# Patient Record
Sex: Female | Born: 1940 | Race: Black or African American | Hispanic: No | Marital: Single | Smoking: Former smoker
Health system: Southern US, Community
[De-identification: ages and names within clinical notes are randomized; demographics above are authoritative.]

## PROBLEM LIST (undated history)

## (undated) DIAGNOSIS — R011 Cardiac murmur, unspecified: Secondary | ICD-10-CM

## (undated) DIAGNOSIS — T4145XA Adverse effect of unspecified anesthetic, initial encounter: Secondary | ICD-10-CM

## (undated) DIAGNOSIS — R0602 Shortness of breath: Secondary | ICD-10-CM

## (undated) DIAGNOSIS — E039 Hypothyroidism, unspecified: Secondary | ICD-10-CM

## (undated) DIAGNOSIS — E785 Hyperlipidemia, unspecified: Secondary | ICD-10-CM

## (undated) DIAGNOSIS — F419 Anxiety disorder, unspecified: Secondary | ICD-10-CM

## (undated) DIAGNOSIS — D151 Benign neoplasm of heart: Secondary | ICD-10-CM

## (undated) DIAGNOSIS — N189 Chronic kidney disease, unspecified: Secondary | ICD-10-CM

## (undated) DIAGNOSIS — R42 Dizziness and giddiness: Secondary | ICD-10-CM

## (undated) DIAGNOSIS — I10 Essential (primary) hypertension: Secondary | ICD-10-CM

## (undated) DIAGNOSIS — M549 Dorsalgia, unspecified: Secondary | ICD-10-CM

## (undated) DIAGNOSIS — Z9289 Personal history of other medical treatment: Secondary | ICD-10-CM

## (undated) DIAGNOSIS — G4730 Sleep apnea, unspecified: Secondary | ICD-10-CM

## (undated) DIAGNOSIS — E079 Disorder of thyroid, unspecified: Secondary | ICD-10-CM

## (undated) DIAGNOSIS — I209 Angina pectoris, unspecified: Secondary | ICD-10-CM

## (undated) DIAGNOSIS — M1990 Unspecified osteoarthritis, unspecified site: Secondary | ICD-10-CM

## (undated) DIAGNOSIS — R002 Palpitations: Secondary | ICD-10-CM

## (undated) DIAGNOSIS — F32A Depression, unspecified: Secondary | ICD-10-CM

## (undated) DIAGNOSIS — F329 Major depressive disorder, single episode, unspecified: Secondary | ICD-10-CM

## (undated) HISTORY — DX: Dorsalgia, unspecified: M54.9

## (undated) HISTORY — DX: Hyperlipidemia, unspecified: E78.5

## (undated) HISTORY — DX: Dizziness and giddiness: R42

## (undated) HISTORY — DX: Essential (primary) hypertension: I10

## (undated) HISTORY — DX: Disorder of thyroid, unspecified: E07.9

## (undated) HISTORY — DX: Unspecified osteoarthritis, unspecified site: M19.90

## (undated) HISTORY — PX: BACK SURGERY: SHX140

## (undated) HISTORY — PX: TOTAL KNEE ARTHROPLASTY: SHX125

## (undated) HISTORY — DX: Benign neoplasm of heart: D15.1

## (undated) HISTORY — DX: Palpitations: R00.2

## (undated) HISTORY — PX: ABDOMINAL HYSTERECTOMY: SHX81

## (undated) HISTORY — DX: Cardiac murmur, unspecified: R01.1

## (undated) HISTORY — PX: CARDIAC CATHETERIZATION: SHX172

---

## 1998-07-25 ENCOUNTER — Ambulatory Visit (HOSPITAL_COMMUNITY): Admission: RE | Admit: 1998-07-25 | Discharge: 1998-07-25 | Payer: Self-pay | Admitting: Gastroenterology

## 1998-10-04 ENCOUNTER — Encounter: Admission: RE | Admit: 1998-10-04 | Discharge: 1999-01-02 | Payer: Self-pay | Admitting: Anesthesiology

## 1999-02-20 ENCOUNTER — Encounter: Admission: RE | Admit: 1999-02-20 | Discharge: 1999-05-21 | Payer: Self-pay | Admitting: *Deleted

## 1999-03-22 ENCOUNTER — Encounter: Admission: RE | Admit: 1999-03-22 | Discharge: 1999-03-22 | Payer: Self-pay | Admitting: Orthopedic Surgery

## 2000-12-11 ENCOUNTER — Ambulatory Visit (HOSPITAL_COMMUNITY): Admission: RE | Admit: 2000-12-11 | Discharge: 2000-12-11 | Payer: Self-pay | Admitting: Cardiology

## 2001-01-01 ENCOUNTER — Inpatient Hospital Stay (HOSPITAL_COMMUNITY): Admission: RE | Admit: 2001-01-01 | Discharge: 2001-01-06 | Payer: Self-pay | Admitting: Specialist

## 2001-09-02 ENCOUNTER — Encounter: Admission: RE | Admit: 2001-09-02 | Discharge: 2001-12-01 | Payer: Self-pay | Admitting: *Deleted

## 2001-11-29 ENCOUNTER — Other Ambulatory Visit: Admission: RE | Admit: 2001-11-29 | Discharge: 2001-11-29 | Payer: Self-pay | Admitting: Obstetrics and Gynecology

## 2001-12-02 ENCOUNTER — Ambulatory Visit (HOSPITAL_COMMUNITY): Admission: RE | Admit: 2001-12-02 | Discharge: 2001-12-02 | Payer: Self-pay | Admitting: Orthopedic Surgery

## 2001-12-02 ENCOUNTER — Encounter: Payer: Self-pay | Admitting: Orthopedic Surgery

## 2002-08-30 ENCOUNTER — Encounter: Admission: RE | Admit: 2002-08-30 | Discharge: 2002-11-28 | Payer: Self-pay | Admitting: *Deleted

## 2002-09-07 ENCOUNTER — Encounter: Payer: Self-pay | Admitting: Specialist

## 2002-09-07 ENCOUNTER — Ambulatory Visit (HOSPITAL_COMMUNITY): Admission: RE | Admit: 2002-09-07 | Discharge: 2002-09-07 | Payer: Self-pay | Admitting: Specialist

## 2002-11-23 ENCOUNTER — Ambulatory Visit (HOSPITAL_COMMUNITY): Admission: RE | Admit: 2002-11-23 | Discharge: 2002-11-23 | Payer: Self-pay | Admitting: Gastroenterology

## 2002-11-23 ENCOUNTER — Encounter (INDEPENDENT_AMBULATORY_CARE_PROVIDER_SITE_OTHER): Payer: Self-pay | Admitting: *Deleted

## 2003-02-03 ENCOUNTER — Other Ambulatory Visit: Admission: RE | Admit: 2003-02-03 | Discharge: 2003-02-03 | Payer: Self-pay | Admitting: Obstetrics and Gynecology

## 2003-10-12 ENCOUNTER — Ambulatory Visit (HOSPITAL_BASED_OUTPATIENT_CLINIC_OR_DEPARTMENT_OTHER): Admission: RE | Admit: 2003-10-12 | Discharge: 2003-10-12 | Payer: Self-pay | Admitting: Family Medicine

## 2004-02-24 IMAGING — CT CT L SPINE W/ CM
3 of 6 series · 11 of 28 positions shown, 12 images · IV contrast (omnipaque)
Comparison: none

FINDINGS
CLINICAL DATA: BILATERAL LEG PAIN.
LUMBAR MYELOGRAM
THE PATIENT WAS PRE-MEDICATED WITH STEROIDS AND BENADRYL SECONDARY TO PRIOR SWELLING OF GLANDS WITH
IODINATED CONTRAST.  THE PATIENT DENIED ANY HISTORY OF DIFFICULTY BREATHING.  THE PROCEDURE AND
ASSOCIATED RISKS WERE REVIEWED WITH THE PATIENT.  WRITTEN AND ORAL CONSENT WAS OBTAINED.  UNDER
FLUOROSCOPIC GUIDANCE AND ASEPTIC TECHNIQUE, AN L5-S1 LUMBAR PUNCTURE WAS PERFORMED WITH A SINGLE
PASS OF A 22 GAUGE SPINAL NEEDLE.  CLEAR CEREBROSPINAL FLUID WAS NOTED.  10 CC OF OMNIPAQUE 180 WAS
INSTILLED.  LUMBAR MYELOGRAM WAS PERFORMED WITHOUT ANY DIFFICULTY.

[Series 4: recon 3: l-spine · axial · 0.27mm/px · z∈[-199,-105]mm · 3 of 140 slices shown, 4 images]
[im 35/140  soft-tissue]
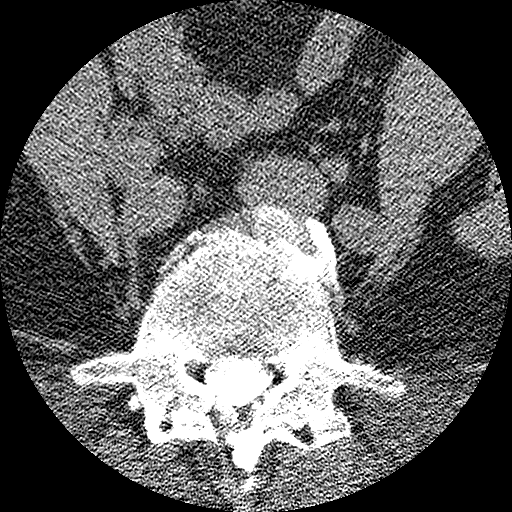
[im 35/140  bone]
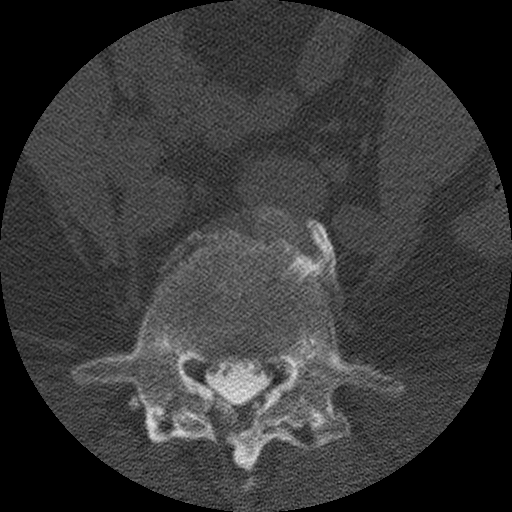
[im 70/140  bone]
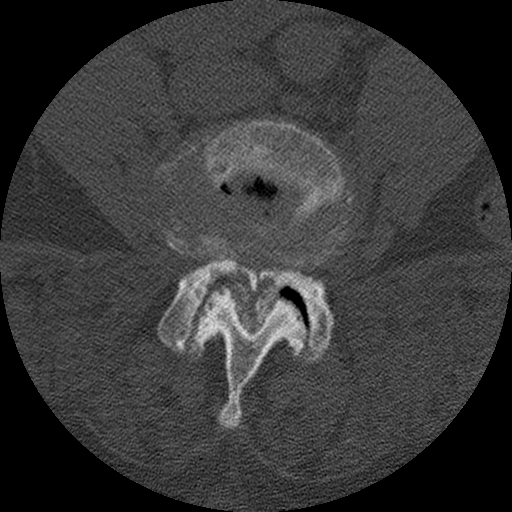
[im 105/140  bone]
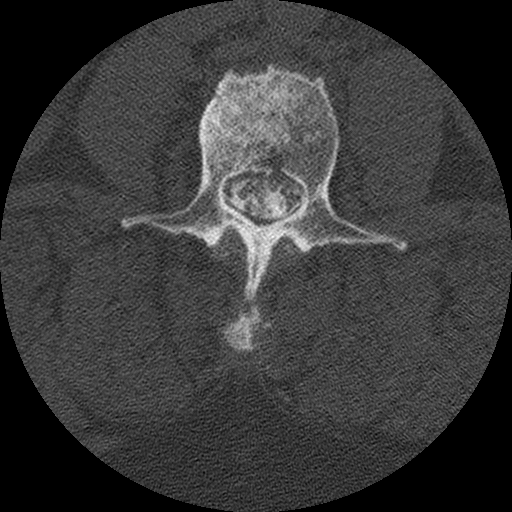

[Series 102: l-spine · axial · 0.35mm/px · z∈[-199,-102]mm · 3 of 144 slices shown]
[im 36/144  bone]
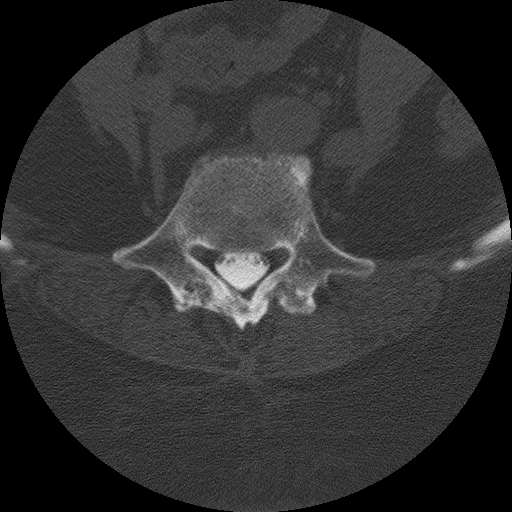
[im 72/144  bone]
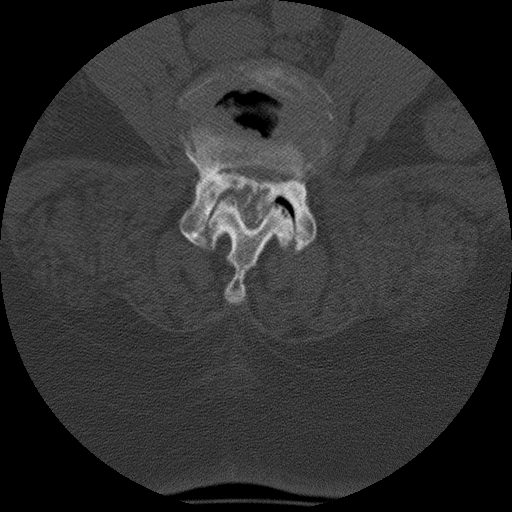
[im 108/144  bone]
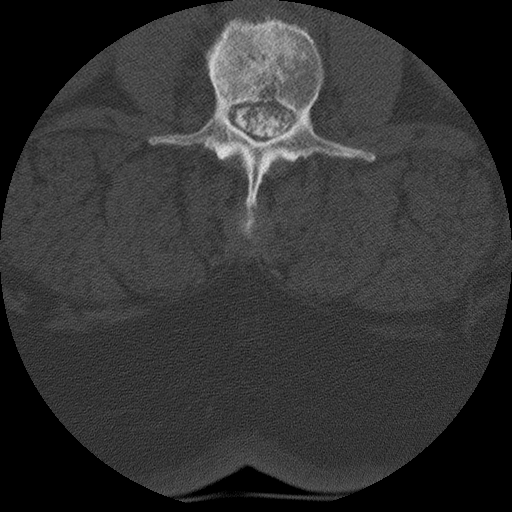

[Series 986: reformatted · sagittal · 0.38mm/px · 5 of 40 slices shown]
[im 7/40  bone]
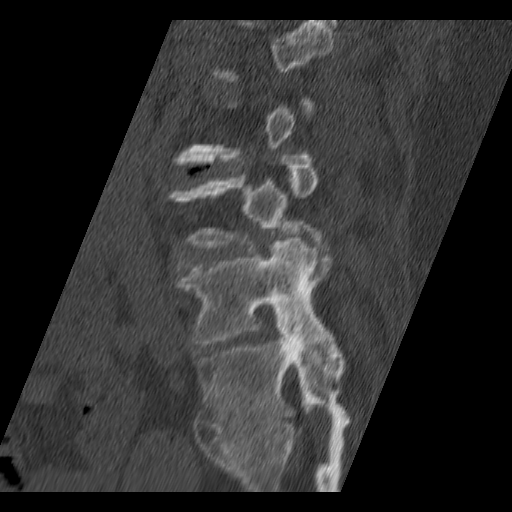
[im 14/40  bone]
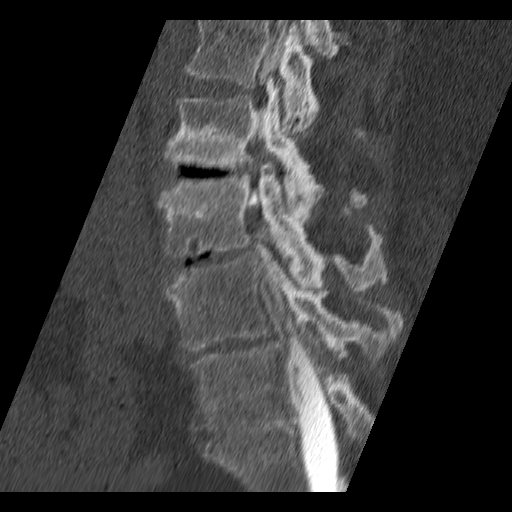
[im 20/40  bone]
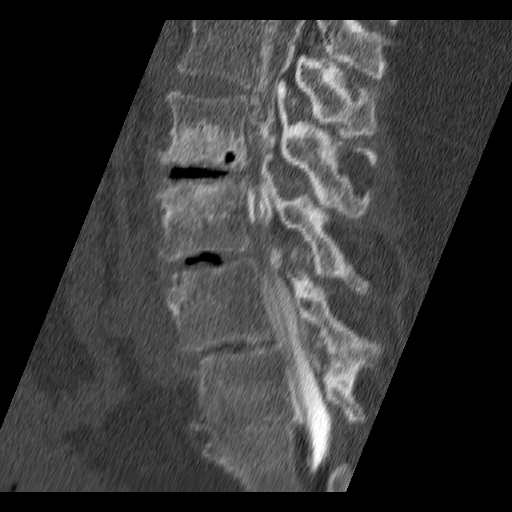
[im 27/40  bone]
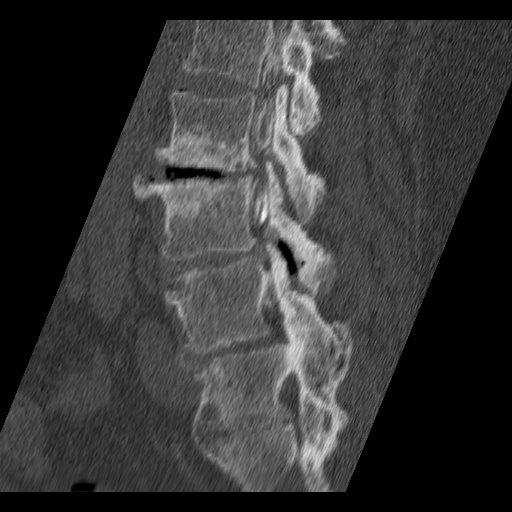
[im 33/40  bone]
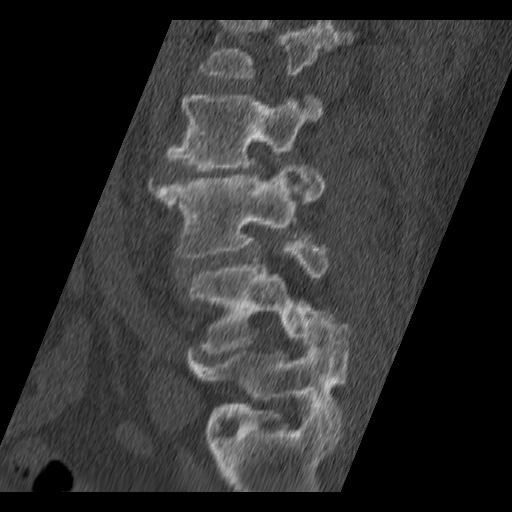

[11 of 28 positions shown; findings below may reference images not displayed]

FINDINGS: SEVERE MULTIFACTORIAL SPINAL STENOSIS L2-3 AND L3-4.  MARKED DISC DEGENERATION, DISC SPACE
NARROWING, END PLATE DEGENERATIVE CHANGES AND OSTEOPHYTE FORMATION AT BOTH OF THESE LEVELS.  SLIGHT
EXCESS MOTION AT EACH OF THESE LEVELS BETWEEN FLEXION AND EXTENSION.  LESS NOTABLE DEGREES OF DISC
DEGENERATION L1-2 AND L4-5.
IMPRESSION
SEVERE MULTIFACTORIAL SPINAL STENOSIS L2-3 AND L3-4.
POST-MYELOGRAM CT WITH SAGITTAL AND CORONAL RECONSTRUCTION
THIN SECTION IMAGING WAS PERFORMED FROM THE L-1 TO THE UPPER SACRUM.
L1-2:  SHORT PEDICLES.  MINIMAL BULGE.  RIGHT GREATER THAN LEFT FACET JOINT DEGENERATIVE CHANGES
AND BONY OVERGROWTH.  MODERATE RIGHT-SIDED AND MILD LEFT-SIDED SPINAL STENOSIS.
L2-3:  SEVERE DISCOGENIC CHANGES WITH VACUUM DISC PHENOMENON, PROMINENT END PLATE DEGENERATIVE
CHANGES AND OSTEOPHYTE FORMATION.  MARKED FACET JOINT BONY OVERGROWTH.  BROAD BASED
BULGE/PROTRUSION/OSTEOPHYTE MINIMALLY MORE NOTABLE IN THE LEFT POSTEROLATERAL POSITION.  SEVERE
MULTIFACTORIAL SPINAL STENOSIS AND SUBARTICULAR LATERAL RECESS STENOSIS (BILATERALLY) WITH MODERATE
TO MARKED BILATERAL NEURAL FORAMINAL NARROWING, SLIGHTLY MORE NOTABLE ON THE RIGHT.  MINIMAL
ANTERIOR SPONDYLOLISTHESIS BETTER APPRECIATED ON SAGITTAL RECONSTRUCTION IMAGES.
L3-4:  MARKED DISC DEGENERATION AND VACUUM DISC PHENOMENON.  MODERATE BULGE/BROAD BASED DISC
PROTRUSION/OSTEOPHYTE.  MARKED FACET JOINT DEGENERATIVE CHANGES AND BONY OVERGROWTH.  MINIMAL
ANTERIOR SPONDYLOLISTHESIS BETTER APPRECIATED ON SAGITTAL RECONSTRUCTED IMAGES.  SHORT PEDICLES.
MULTIFACTORIAL SEVERE SPINAL STENOSIS, BILATERAL SUBARTICULAR LATERAL RECESS STENOSIS AND BILATERAL
NEURAL FORAMINAL NARROWING.
L4-5:  MODERATE TO MARKED DISC DEGENERATION WITH ADJACENT END PLATE CHANGES.  MODERATE BULGE/SMALL
BROAD BASED PROTRUSION.  MODERATE BILATERAL FACET JOINT DEGENERATIVE CHANGES AND BONY OVERGROWTH.
MODERATE TO SLIGHTLY MARKED MULTIFACTORIAL SPINAL STENOSIS AND BILATERAL SUBARTICULAR BILATERAL
RECESS STENOSIS.  MODERATE TO SLIGHTLY MARKED RIGHT-SIDED AND MODERATE LEFT-SIDED NEURAL FORAMINAL
NARROWING.  THE FACET JOINTS APPEAR FUSED.
L5-S1:  APPARENT FUSION OF THE DISC SPACE AND FACET JOINTS.  MILD BILATERAL NEURAL FORAMINAL
NARROWING.  MILD SPINAL STENOSIS.  LEFT-SIDED HEMILAMINECTOMY.  MILD TO SLIGHTLY MODERATE BILATERAL
L5-S1 NEURAL FORAMINAL NARROWING.
SIGNIFICANT BILATERAL SACROILIAC JOINT DEGENERATIVE CHANGES.
IMPRESSION
SEVERE MULTIFACTORIAL SPINAL STENOSIS, BILATERAL SUBARTICULAR LATERAL RECESS STENOSIS AND BILATERAL
NEURAL FORAMINAL NARROWING AT L2-3 AND L3-4.  LESS NOTABLE BUT STILL SIGNIFICANT DEGENERATIVE
CHANGE AT L4-5 AND L1-2 AS DISCUSSED ABOVE.
FUSION OF L4-5 AND L5-S1 FACETS.  L5-S1 DISC APPEARS FUSED.  PRIOR LEFT-SIDED L5-S1
HEMILAMINECTOMY.  MILD TO SLIGHTLY MODERATE BILATERAL L5-S1 NEURAL FORAMINAL NARROWING.
SEVERE BILATERAL SACROILIAC JOINT DEGENERATIVE CHANGES.
CT MULTIPLANAR RECONSTRUCTION
MULTIPLANAR REFORMATTED CT IMAGES WERE RECONSTRUCTED FROM THE AXIAL CT DATA SET.  THESE IMAGES WERE
REVIEWED, AND PERTINENT FINDINGS ARE INCLUDED IN THE ACCOMPANYING COMPLETE CT REPORT.
IMPRESSION
SEE COMPLETE CT REPORT.

## 2004-03-28 ENCOUNTER — Ambulatory Visit (HOSPITAL_COMMUNITY): Admission: RE | Admit: 2004-03-28 | Discharge: 2004-03-28 | Payer: Self-pay | Admitting: Family Medicine

## 2004-06-09 HISTORY — PX: BACK SURGERY: SHX140

## 2004-11-10 ENCOUNTER — Encounter: Admission: RE | Admit: 2004-11-10 | Discharge: 2004-11-10 | Payer: Self-pay | Admitting: Neurosurgery

## 2010-04-23 ENCOUNTER — Ambulatory Visit (HOSPITAL_COMMUNITY): Admission: RE | Admit: 2010-04-23 | Discharge: 2010-04-23 | Payer: Self-pay | Admitting: Obstetrics and Gynecology

## 2010-06-09 DIAGNOSIS — Z9289 Personal history of other medical treatment: Secondary | ICD-10-CM

## 2010-06-09 HISTORY — DX: Personal history of other medical treatment: Z92.89

## 2010-08-20 LAB — BASIC METABOLIC PANEL
BUN: 14 mg/dL (ref 6–23)
CO2: 30 mEq/L (ref 19–32)
Calcium: 9.7 mg/dL (ref 8.4–10.5)
Chloride: 102 mEq/L (ref 96–112)
Creatinine, Ser: 0.99 mg/dL (ref 0.4–1.2)
GFR calc Af Amer: >60 mL/min (ref >60)
GFR calc non Af Amer: 56 mL/min — ABNORMAL LOW (ref >60)
Glucose, Bld: 92 mg/dL (ref 70–99)
Potassium: 3.6 mEq/L (ref 3.5–5.1)
Sodium: 141 mEq/L (ref 135–145)

## 2010-08-20 LAB — GLUCOSE, CAPILLARY
Glucose-Capillary: 139 mg/dL — ABNORMAL HIGH (ref 70–99)
Glucose-Capillary: 151 mg/dL — ABNORMAL HIGH (ref 70–99)

## 2010-08-20 LAB — CBC
HCT: 42.2 % (ref 36.0–46.0)
Hemoglobin: 14.1 g/dL (ref 12.0–15.0)
MCH: 28.6 pg (ref 26.0–34.0)
MCHC: 33.3 g/dL (ref 30.0–36.0)
MCV: 86.1 fL (ref 78.0–100.0)
Platelets: 257 10*3/uL (ref 150–400)
RBC: 4.91 MIL/uL (ref 3.87–5.11)
RDW: 14.4 % (ref 11.5–15.5)
WBC: 5.7 10*3/uL (ref 4.0–10.5)

## 2010-08-20 LAB — COMPREHENSIVE METABOLIC PANEL WITH GFR
ALT: 28 U/L (ref 0–35)
AST: 31 U/L (ref 0–37)
Albumin: 4.2 g/dL (ref 3.5–5.2)
Alkaline Phosphatase: 68 U/L (ref 39–117)
BUN: 14 mg/dL (ref 6–23)
CO2: 29 meq/L (ref 19–32)
Calcium: 10.3 mg/dL (ref 8.4–10.5)
Chloride: 101 meq/L (ref 96–112)
Creatinine, Ser: 1.02 mg/dL (ref 0.4–1.2)
GFR calc non Af Amer: 54 mL/min — ABNORMAL LOW
Glucose, Bld: 140 mg/dL — ABNORMAL HIGH (ref 70–99)
Potassium: 3.7 meq/L (ref 3.5–5.1)
Sodium: 140 meq/L (ref 135–145)
Total Bilirubin: 0.7 mg/dL (ref 0.3–1.2)
Total Protein: 7.8 g/dL (ref 6.0–8.3)

## 2010-08-20 LAB — PROTIME-INR: Prothrombin Time: 13.4 seconds (ref 11.6–15.2)

## 2010-08-20 LAB — APTT: aPTT: 27 seconds (ref 24–37)

## 2010-10-25 NOTE — Cardiovascular Report (Signed)
[place]. [facility]  Patient:    Erin Haynes, Erin Haynes                      MRN: [id number] Proc. Date: 12/11/00 Attending:  Peter M. Swazil[place], M.D. CC:         Clovis Pu. Patty Sermons, M.D.  Heather Roberts, M.D.  Benny Lennert, M.D.   Cardiac Catheterization  INDICATIONS:  The patient is a 70 year old black female with a history of hypertension and family history of coronary disease.  She had had a recent Adenosine Cardiolite study as preoperative evaluation for knee surgery.  She was found to have an anterior wall defect.  ACCESS:  Via the right femoral artery using the standard Seldinger technique.  EQUIPMENT:  Six French 4 cm right and left Judkins catheters, 6-French pigtail catheter, 6-French arterial sheath.  MEDICATIONS:  Local anesthesia with 1% Xylocaine, Versed 1 mg IV.  CONTRAST:  120 cc Omnipaque.  HEMODYNAMIC DATA:  Aortic pressure 188/90, with a mean of 132.  Left ventricular pressure 188, with an EDP of 18 mmHg.  ANGIOGRAPHIC DATA: The left coronary artery arises and distributes normally.  The left main coronary artery is normal.  The left anterior descending artery is a large vessel which appears normal.  There is a large diagonal branch which appears to have mild disease at its ostium of less than 30%.  The left circumflex vessel is a codominant vessel giving rise to three marginal branches.  It is normal.  The right coronary artery is also a codominant vessel.  It has 10% irregularities in the proximal vessel.  Otherwise, no significant disease is seen.  LEFT VENTRICULAR ANGIOGRAPHY:  Left ventricular angiography was performed in the RAO view and demonstrates normal left ventricular size with hyperdynamic contractility.  Ejection fraction is estimated at 80%.  Wall motion analysis is normal.  FINAL INTERPRETATION: 1. Minimal nonobstructive atherosclerotic coronary artery disease. 2. Hyperdynamic left ventricular  function. DD:  12/11/00 TD:  12/11/00 Job: [id number]

## 2010-10-25 NOTE — Op Note (Signed)
[place]. [facility]  Patient:    Erin Haynes, Erin Haynes                    MRN: [id number] Proc. Date: 01/01/01 Adm. 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  OPERATION:  Left total knee arthroplasty.  SURGEON:  R. Valma Cava, M.D.  ASSISTANT:  Dorie Rank, P.A.-C.  ANESTHESIA:  General followed by postoperative epidural.  ANESTHESIOLOGIST:  Cliffton Asters. Crews, M.D.  ESTIMATED BLOOD LOSS:  Less than 100 cc.  DRAINS:  Two medium Hemovac drains.  COMPLICATIONS:  None.  TOURNIQUET TIME:  2 hours 5 minutes at 375 mmHg.  DISPOSITION:  To recovery room stable.  OPERATIVE IMPLANTS:  Osteonics components, all cemented.  Size 9 femur.  Size 9 tibia, 10 mm polyethylene insert and 26 mm all polyethylene patella, posterior stabilized.  DESCRIPTION OF PROCEDURE:  The patient was counseled in the holding area.  An IV had been started and antibiotics given.  She was then taken to the operating room and placed in the supine position with general anesthesia.  The left knee was examined.  She had an 8 degree flexion contracture.  She had a valgus malalignment.  She could flex 110 degrees.  Foley catheter was placed utilizing sterile technique by the OR surgery nurse. Again, preoperative antibiotics had been given.  The leg was therefore elevated, exsanguinated with Esmarch and a tourniquet was placed at 375 mmHg.  A straight midline incision was made through the skin and subcutaneous tissues.  Small veins were electrocoagulated.  Skin flaps were developed at the appropriate level.  Medial parapatellar arthrotomy was performed.  The patella was everted.  A minimal soft tissue release was done to the tibia secondary to her valgus  malalignment.  The knee had severe end-stage osteoarthritis with large osteophyte formation and bone-on-bone contact all three compartment.  Cruciate ligaments were resected, and hemostasis was obtained.  Starting hole was made into the distal femur and the canal was irrigated until effluent was clear.  Intramedullary rod was gently placed.  We chose a 5 degree valgus cut based on previous x-ray based upon x-rays and calculation and took a 12 mm cut off the distal femur.  At this point in time, the distal femur was sized, size #9.  Rotational marks were made and the distal femur was cut to fit a size #9 implant.  Attention was directed to the proximal tibia.  Lateral capsular release was done at the tibial insertion.  The IT band was protected at this point in time.  The lateral meniscus was resected.  The genicula was coagulated.  The medial meniscus was resected, and genicula was coagulated.  The medial meniscus was resected and genicula was coagulated.  The tibial eminence was resected.  Also, throughout the case, osteophytes were being moved sequentially.  The proximal tibia was found to be a size #9.  Starting hole was made. Separator utilized and then again the canal was gently irrigated until the effluent was clear, and the medullary rod was gently placed.  We chose a 5 degree valgus cut and took a 10 mm cut based upon the medial side which was the less deficient  side and a posterior 5 degree slope after setting the alignment.  Posteromedial and posterior proximal osteophytes were removed under direct visualization as well as posterior osteophytes in the tibia itself.  The femoral trochlea was prepared in standard fashion.  At this point in time, trials of 9 femur, 9 tibia with a 10 mm insert, it was tight in flexion and extension, therefore these were removed and the 10 mm was taken off the proximal tibia.  At this point, with trials of 9 and 9, respectively, with the 10 mm  insert we had excellent alignment, rotation, soft tissue balance in flexion and extension.  Rotation marks were made in the proximal tibia.  The delta keel was performed in the standard fashion.  The patella was found to measure 26 mm in its thickest diameter.  At this point in time, it was reamed to a depth of 10 mm.  It was found to be a size 26.  It was then reamed to a depth of 10 mm.  Locking holes were made and excess bone was removed.  At this point in time, utilizing moderate and smooth technique, all components were cemented in place, size 9 tibia, size 9 femur with a 26 mm all polyethylene patella.  After the cement had cured, trials were placed on the tibial insert and with a 10 mm insert we had excellent range of motion, soft tissue balance in flexion and extension.  Patellar tracking was lateral as one would anticipate and after a lateral release with electrocautery, patellofemoral tracking was appropriate.  The tibial trial was then removed.  A meticulous hemostasis was done of the knee at this point in time and a final 10 mm insert was then placed posterior stabilized.  Two medium Hemovac drains were placed.  I also placed bone wax to the knee on the exposed bony areas to help with the hemostasis.  The area had been searched meticulously and all vessels had been coagulated.  The sequential closure of layers was done, synovium Vicryl, arthrotomy Vicryl, subcu Vicryl and then the skin was closed with running subcuticular Monocryl suture.  Benzoin and Steri-Strips were applied.  The drain was hooked to suction.  Sterile dressing was applied to the knee.  Tourniquet was deflated. She had normal pulses in the foot and ankle at the end of the case and excellent alignment.  Sterile compressive wrap was applied to the knee.  Ice pack and knee immobilizer.  She was given another gram of Ancef intravenously and the tourniquet was deflated.  She was then turned lateral where  the epidural catheter was placed by Dr. Ivin Booty.  She tolerated the procedure well. There were no complications.  She was extubated and taken from the operating  room to the PACU in stable condition.  Sponge and needle counts were correct. We also were very cautious with her knee secondary to her valgus malalignment, and she is being carefully monitored in the PACU for _______________ function and appropriate measures taken if she in fact she is found to have a problem. DD:  01/01/01 TD:  01/02/01 Job: [id number]

## 2010-10-25 NOTE — Procedures (Signed)
[place]. [facility]  Patient:    Erin Haynes, Erin Haynes                    MRN: [id number] Proc. Date: 01/01/01 Adm. Date:  [id number] Attending:  Erasmo Leventhal CC:         Anesthesia Department   Procedure Report  PREOPERATIVE DIAGNOSIS:  Degenerative joint disease of the knee.  POSTOPERATIVE DIAGNOSIS:  Degenerative joint disease of the knee.  OPERATION:  Total knee replacement performed by R. Valma Cava, M.D.  ANESTHESIA PROCEDURE:  Placement of a lumbar epidural catheter for postoperative analgesia.  SURGEON:  Burna Forts, M.D.  INDICATIONS:  Preoperatively, the epidural analgesia had been requested by Dr. Thomasena Edis for the postoperative analgesic infusion management for this patient undergoing total knee replacement.  Dr. Ivin Booty in fact discussed the procedure with the patient including alternatives for pain control, and the patient consented to the placement of the epidural catheter for her postoperative analgesia.  At the end of the operative procedure, Dr. Ivin Booty was unavailable, and I was consulted for placement of the epidural catheter.  DESCRIPTION OF PROCEDURE:  The patient was turned to the left lateral decubitus position.  A sterile prep of the lower back was conducted.  Using a #17-gauge needle adjacent to the L3-4 interspace, the epidural space was contacted with a loss of resistance technique, and a catheter threaded approximately 2-3 cm beyond the needle tip and the needle was removed.  After a negative aspiration for both blood and cerebrospinal fluid, the catheter was injected with a total of 8 cc of 0.25% Marcaine containing 100 mcg of fentanyl.  The patient was secured in place with tape, the patient turned supine, and prepared for transport to the PACU.  DISPOSITION:  The patient will be followed daily by the department of anesthesiology for her postoperative analgesia of the epidural catheter. DD:  01/01/01 TD:   01/03/01 Job: [id number]

## 2010-10-25 NOTE — Consult Note (Signed)
[place]. [facility]  Patient:    Erin Haynes, Erin Haynes                    MRN: [id number] Proc. Date: 01/05/01 Adm. Date:  [id number] Attending:  Erasmo Leventhal CC:         R. Valma Cava, M.D.  Heather Roberts, M.D.  Thomas A. Patty Sermons, M.D.   Consultation Report  HISTORY OF PRESENT ILLNESS:  Ms. Crossett is a 70 year old black female well known to me.  She has a history of hypertension, arthritis.  She had an abnormal Cardiolite on a preoperative evaluation so cardiac catheterization was done on December 11, 2000.  This was normal.  Subsequently underwent left knee surgery on January 01, 2001.  She has been hypokalemic since her surgery. Potassium level preoperative was 3.4.  She denies any muscle cramps, dizziness, chest pain, or palpitations.  PAST MEDICAL HISTORY:  Significant for hypertension, hyperlipidemia.  She has had a prior right knee replacement and prior hysterectomy.  CURRENT MEDICATIONS:  1. Colace 100 mg b.i.d.  2. ______ one capsule t.i.d.  3. Hydrochlorothiazide 25 mg q.d.  4. Synthroid 112 mcg q.d.  5. Diltiazem 180 mg b.i.d.  6. Toprol XL 100 mg q.d.  7. Coumadin 7.5 mg q.d.  8. Prinivil 20 mg q.d.  9. Cytomel 12.5 mcg q.d. 10. Potassium 40 mEq b.i.d.  SOCIAL HISTORY:  Noncontributory.  FAMILY HISTORY:  Noncontributory.  PHYSICAL EXAMINATION  GENERAL:  Patient is a pleasant black female in no apparent distress.  VITAL SIGNS:  Blood pressure 152/67, pulse 76 and regular.  She is afebrile. Respirations 20.  HEENT:  Unremarkable.  NECK:  No JVD or bruits.  LUNGS:  Clear.  CARDIAC:  Regular rate and rhythm without murmurs, rubs, gallops, or clicks.  ABDOMEN:  Soft and nontender without masses or bruits.  EXTREMITIES:  Left knee scar.  No edema.  NEUROLOGIC:  Nonfocal.  LABORATORIES:  Hemoglobin 13.2 preoperative, now 9.6.  PT 17 with INR 1.5. Sodium 140, potassium 2.9, chloride 98, CO2 33, BUN 9, creatinine  0.8, glucose 146.  Urinalysis shows specific gravity 1.012, pH 6.5, otherwise negative.  IMPRESSION: 1. 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 2. Hypertension. 3. Status post left knee surgery.  PLAN:  Will stop her hydrochlorothiazide.  Continue diltiazem, Toprol XL, and Prinivil for blood pressure control.  Will replete her potassium with 40 mEq q.i.d.  She may well not require further diuretic therapy but if she does in the future a potassium sparing diuretic may be more appropriate.  If her potassium does not normalize with stopping her hydrochlorothiazide, consider workup of other causes of hypokalemia. DD:  01/05/01 TD:  01/05/01 Job: [id number]

## 2010-10-25 NOTE — Op Note (Signed)
NAME:  Erin Haynes, Erin Haynes                       ACCOUNT NO.:  [id number]   MEDICAL RECORD NO.:  [id number]                   PATIENT TYPE:  AMB   LOCATION:  ENDO                                 FACILITY:  [facility]   PHYSICIAN:  Anselmo Rod, M.D.               DATE OF BIRTH:  1940/11/18   DATE OF PROCEDURE:  11/23/2002  DATE OF DISCHARGE:                                 OPERATIVE REPORT   PROCEDURE PERFORMED:  Colonoscopy with multiple cold biopsies.   ENDOSCOPIST:  Charna Elizabeth, M.D.   INSTRUMENT USED:  Olympus video colonoscope.   INDICATIONS FOR PROCEDURE:  The patient is a 70 year old African-American  female with a history of rectal bleeding and loose stools.  Rule out colonic  polyps, masses, hemorrhoids, etc.   PREPROCEDURE PREPARATION:  Informed consent was procured from the patient.  The patient was fasted for eight hours prior to the procedure and prepped  with a bottle of magnesium citrate and a gallon of GoLYTELY the night prior  to the procedure.   PREPROCEDURE PHYSICAL:  The patient had stable vital signs.  Neck supple.  Chest clear to auscultation.  S1 and S2 regular.  Abdomen soft with normal  bowel sounds.   DESCRIPTION OF PROCEDURE:  The patient was placed in left lateral decubitus  position and sedated with 70 mg of Demerol and 10 mg of Versed  intravenously.  Once the patient was adequately sedated and maintained on  low flow oxygen and continuous cardiac monitoring, the Olympus video  colonoscope was advanced from the rectum to the cecum with difficulty.  There was a large amount of residual stool in the colon and multiple washes  were done.  The patient's position was changed from the left lateral to the  supine position and abdominal pressure was applied gently to facilitate the  entry of the scope into the cecum.  The appendicular orifice and ileocecal  valve were clearly visualized and photographed.  Multiple small sessile  polyps were biopsied from  the left colon to the rectosigmoid area.  No large  masses or polyps were seen.  There was evidence of right-sided  diverticulosis.  Retroflexion in the rectum revealed prominent internal  hemorrhoids which may be responsible for the patient's rectal bleeding.  No  large masses were present.   IMPRESSION:  1. Multiple small sessile polyps biopsied from the left colon and from the     rectosigmoid.  2. Prominent internal hemorrhoids seen on retroflexion.  3. Right-sided diverticulosis.  4. Normal-appearing cecum.   RECOMMENDATIONS:  1. Await pathology results.  2. Avoid all nonsteroidals including aspirin for now.  3. A high fiber diet with liberal fluid intake.  4. Outpatient follow-up in the next two weeks for further recommendations.  Anselmo Rod, M.D.    JNM/MEDQ  D:  11/23/2002  T:  11/24/2002  Job:  [id number]   cc:   Christella Noa, M.D.  [address] [place]  [place], [place] [address]  Fax: [phone]

## 2010-10-25 NOTE — H&P (Signed)
[name]. [facility]  Patient:    Erin Haynes, Erin Haynes                      MRN: [id number] Adm. Date:  12/11/00 Attending:  Peter M. Swaziland, M.D. CC:         Heather Roberts, M.D.  Benny Lennert, M.D.  Thomas A. Patty Sermons, M.D.  Erin Haynes, M.D.   History and Physical  DATE OF BIRTH:  11/08/1940  CHIEF COMPLAINT:  Abnormal stress test.  HISTORY OF PRESENT ILLNESS:  Erin Haynes is a 70 year old black female who has a history of chronic arthritis and hypertension.  She also has a history of hypercholesterolemia and a family history of early coronary disease.  Patient was recently evaluated by Dr. Clovis Pu. Brackbill for preoperative clearance for knee surgery.  She had a prior stress test in February of 2000; at that time, she was able to exercise on a rehab protocol for five minutes and had some nonspecific ST-T wave changes but no ischemia.  She recently underwent an adenosine Cardiolite study and this demonstrated evidence of anterior wall ischemia.  Her left ventricular function was normal with an ejection fraction of 65%.  Because of this abnormality, she is now admitted for cardiac catheterization.  Her other prior cardiac evaluation included an echocardiogram at [facility] in 1994 which showed mild concentric LVH and no significant valvular disease.  PAST MEDICAL HISTORY:  Significant for hypertension.  She has a history of hypercholesterolemia but cannot recall her levels.  She has had prior right knee replacement and is scheduled to have left knee surgery.  Patient has a history of uterine fibroid and is status post hysterectomy.  CURRENT MEDICATIONS:  1. HCTZ 25 mg per day.  2. Potassium 20 mEq per day.  3. Levoxyl 0.112 mg daily.  4. Cartia XT 180 mg two tablets daily.  5. Prinivil 20 mg two tablets b.i.d.  6. Premarin 0.9 mg daily.  7. Cytomel 25 mcg one-half tablet daily.  8. Methocarbamol 750 mg one t.i.d.  9. Mobic 15 mg per  day. 10. Ultram 50 mg q.6-8h. p.r.n. 11. Toprol-XL 100 mg per day. 12. Vitamin E 400 mg daily. 13. Primrose oil 500 q.d. 14. St. Johns wort 150 mg per day. 15. Baby aspirin daily. 16. Nexium 40 mg p.r.n.  ALLERGIES:  Patient is allergic to IVP DYE.  SOCIAL HISTORY:  Patient is a single mother.  She quit smoking in 1993 and denies alcohol use.  She has one son who is a Social research officer, government, having graduated from [employer].  FAMILY HISTORY:  Father died in his mid-60s of a myocardial infarction. Mother died at age 68 of a heart attack and stroke.  REVIEW OF SYSTEMS:  Patients states her weight has been stable.  She follows her thyroid abnormality with Dr. Fritzi Haynes.  Her gynecologist is Dr. Miguel Haynes.  All other review of systems is unremarkable.  PHYSICAL EXAMINATION:  GENERAL:  Patient is an obese black female in no apparent distress.  Weight is 230 pounds.  VITAL SIGNS:  Blood pressure is 150/96.  Pulse is 68 and regular.  HEENT:  Pupils are equal, round and reactive.  Funduscopic exam is benign. Conjunctivae are clear.  Oropharynx is clear.  NECK:  Supple without JVD, adenopathy, thyromegaly or bruits.  LUNGS:  Clear.  CARDIAC:  Exam reveals a regular rate and rhythm, normal S1 and S2, with a grade 2/6 systolic ejection  murmur heard best at the base.  There is no diastolic murmur or gallop.  ABDOMEN:  Obese, soft and nontender.  She has no hepatosplenomegaly, masses or bruits.  EXTREMITIES:  Without phlebitis or edema.  Pulses are good.  NEUROLOGIC:  Exam is nonfocal.  LABORATORY DATA:  ECG shows normal sinus rhythm with poor R wave progression, V1 through V3, no acute changes.  Chest x-ray shows cardiomegaly with pulmonary venous hypertension, otherwise, no acute change.  IMPRESSION:  1. Abnormal adenosine Cardiolite study with evidence of anterior wall     ischemia.  2. Hypercholesterolemia.  3. Hypertension.  4. Osteoarthritis, status  post right knee surgery, scheduled for left knee     surgery.  5. Hypothyroidism.  6. Status post hysterectomy.  PLAN:  The patient will be admitted for cardiac catheterization, with further therapy pending these results. DD:  12/03/00 TD:  12/04/00 Job: [id number]

## 2010-10-25 NOTE — Discharge Summary (Signed)
[place]. [facility]  Patient:    Erin Haynes, Erin Haynes Visit Number: [id number] MRN: [id number]          Service Type: Attending:  R. Valma Cava, M.D. Dictated by:   Alexzandrew L. Perkins, P.A.-C. Adm. Date:  01/01/01 Disc. Date: 01/06/01                             Discharge Summary  ADMITTING DIAGNOSES: 1. Left knee end-stage osteoarthritis. 2. Hypertension. 3. Hypothyroidism.  DISCHARGE DIAGNOSES: 1. 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 2. Status post left total knee replacement arthroplasty. 3. Postoperative hemorrhagic anemia. 4. Mild hyponatremia. 5. Postoperative hypokalemia. 6. Hypertension. 7. Hypothyroidism.  PROCEDURES: 1. The patient was taken to the operating room on January 01, 2001, and underwent    a left total knee replacement arthroplasty by Dr. Benny Lennert,    assisted by Dorie Rank, P.A.-C.  Surgery under general anesthesia, followed    by postoperative epidural.  Two Hemovac drains were placed at the time of    surgery.  Tourniquet time of two hours and five minutes at 375 mmHg. 2. Following the total knee replacement, the patient underwent placement of a    lumbar epidural catheter for postoperative analgesic by Dr. Ester Rink.  CONSULTATION:  Cardiology, Dr. Peter Swaziland.  HISTORY OF PRESENT ILLNESS:  The patient is a 70 year old female, well-known to [facility].  She has been seen in the clinic in the past and evaluated recently for severe left knee pain.  The pain has been increasing to the point that it started to interfere with her daily activities. It is felt that the pain has progressed to the point where she would benefit from undergoing total knee replacement.  Risks and benefits of the procedure have been discussed with the patient and she has elected to proceed with surgery.  LABORATORY DATA AND X-RAY FINDINGS:  CBC on admission with hemoglobin 13.2, hematocrit  37.8, white cell count 5.1, red count 4.60.  Postop H&H down to 10.8 and 30.9.  It continued to decline down to 9.4 and 27.1, however, came back up.  It was on the increase at 9.6 and 27.2.  Differential on the admission CBC all within normal limits.  PT/PTT on admission were 13.3 and 27 respectively with an INR of 1.0.  Serum proteins followed per Coumadin protocol.  Last noted PT/INR were 16.8 and 1.5.  Chemistry panel on admission started out with a lower sodium of 3.4, glucose 118 with remaining chemistry panel all within normal limits.  Serial BMETs were followed.  Potassium did drop postop to 2.7 and came back up to 3.0, but dropped again to 2.8 and was back up to 3.3 prior to discharge.  Calcium did drop from 9.7 to 8.2 and was back up to 9.1.  Sodium dropped from 138 down to 134 and was back up to 139. Urinalysis on admission was positive for protein with 0-2 wbcs, 0-2 rbcs. Followup UA essentially negative except for rare epithelial cells.  Blood group type O positive.  HOSPITAL COURSE:  The patient was admitted to [facility] and taken to OR and underwent the above-stated procedure without complication.  The patient was tolerating the procedure well and was taken to the recovery room after having an epidural placed.  She was started on a Fentanyl pump which was regulated by anesthesia.  Coumadin was held initially and not started until postop day #1.  Epidural was pulled.  On postop day #2, it was noted she started out with a low potassium and was placed on potassium supplements. Hemovac drain was pulled on postop day #1.  Her sodium also dropped postoperatively.  She was placed on potassium supplements and fluids were adjusted.  The potassium level initially improved, but dropped again. Dressing changes were initiated on postop day #2 and wound was healing well. The wound was followed throughout the hospital course.  She did have a drop in her hemoglobin postop, but did  not require transfusion and her blood count started to increase prior to her discharge.  She had had some slight postoperative temperature which was treated with antipyretics and incentive spirometer.  Due to the persistent hypokalemia, cardiology consult was called in.  The patient was seen in consultation by Dr. Peter Swaziland.  It was felt that the hypokalemia was most likely due to chronic hydrochlorothiazide use. The hydrochlorothiazide was discontinued.  The patient was instructed to continue with her Diltiazem, Toprol and Prinivil.  She was increased up to 40 mEq q.i.d.  She did respond and her potassium came back up to within normal limits.  From an orthopedic standpoint, she progressed very well with her physical therapy.  She was ambulating approximately 75 feet by postop day #2 and got up to greater than 200 feet by postop day #4.  Once her electrolyte imbalance had improved and she progressed very well with her physical therapy, it was decided once she was stable, she would be discharged home.  The patient was seen in followup by Dr. Patty Sermons on the date of discharge and was recommended to followup in his office in three to four weeks following her release from hospital.  The patient was doing fine and was released on January 06, 2001.  DISPOSITION:  The patient is discharged to home on January 06, 2001.  DISCHARGE MEDICATIONS: 1. Coumadin as per pharmacy protocol. 2. Percocet for pain. 3. Robaxin for spasm. 4. Stop hydrochlorothiazide.  FOLLOWUP:  The patient will follow up with Dr. Hayden Rasmussen two weeks from surgery.  Call office for an appointment.  Follow up with Dr. Patty Sermons in three to four weeks following discharge.  Contact his office for followup.  DIET:  Low carbohydrate diet.  ACTIVITY:  Weightbearing as tolerated to the left lower extremity.  Continue with gait training ambulation.  SPECIAL INSTRUCTIONS:  Home health PT and nursing with daily dressing  changes. She may start showering, but keep blisters intact.  CONDITION ON DISCHARGE:  Stable.  Dictated by:   Alexzandrew L. Perkins, P.A.-C. Attending:  R. Valma Cava, M.D. DD:  10/11/01 TD:  10/14/01 Job: [id number]

## 2011-01-10 ENCOUNTER — Other Ambulatory Visit: Payer: Self-pay | Admitting: *Deleted

## 2011-01-10 ENCOUNTER — Other Ambulatory Visit: Payer: Self-pay | Admitting: Orthopedic Surgery

## 2011-01-10 DIAGNOSIS — M79601 Pain in right arm: Secondary | ICD-10-CM

## 2011-01-16 ENCOUNTER — Other Ambulatory Visit: Payer: Self-pay

## 2011-02-03 ENCOUNTER — Telehealth: Payer: Self-pay | Admitting: Cardiology

## 2011-02-03 NOTE — Telephone Encounter (Signed)
jodie called and needs OV, Stress, Echo, EKG and Cath report as available.  Fax to [phone].  Patient schedule on 29th for surgery.

## 2011-02-03 NOTE — Telephone Encounter (Signed)
Faxed over last OV Note, ECHO, EKG, and Stress, and Cardiac Cath today.

## 2011-02-20 ENCOUNTER — Encounter: Payer: Self-pay | Admitting: *Deleted

## 2011-02-20 DIAGNOSIS — M1990 Unspecified osteoarthritis, unspecified site: Secondary | ICD-10-CM | POA: Insufficient documentation

## 2011-02-20 DIAGNOSIS — R002 Palpitations: Secondary | ICD-10-CM | POA: Insufficient documentation

## 2011-02-20 DIAGNOSIS — M549 Dorsalgia, unspecified: Secondary | ICD-10-CM | POA: Insufficient documentation

## 2011-02-20 DIAGNOSIS — I2510 Atherosclerotic heart disease of native coronary artery without angina pectoris: Secondary | ICD-10-CM | POA: Insufficient documentation

## 2011-02-20 DIAGNOSIS — R42 Dizziness and giddiness: Secondary | ICD-10-CM | POA: Insufficient documentation

## 2011-02-20 DIAGNOSIS — R011 Cardiac murmur, unspecified: Secondary | ICD-10-CM | POA: Insufficient documentation

## 2011-03-03 ENCOUNTER — Ambulatory Visit (INDEPENDENT_AMBULATORY_CARE_PROVIDER_SITE_OTHER): Payer: Medicare Other | Admitting: Cardiology

## 2011-03-03 ENCOUNTER — Encounter: Payer: Self-pay | Admitting: Cardiology

## 2011-03-03 DIAGNOSIS — R079 Chest pain, unspecified: Secondary | ICD-10-CM

## 2011-03-03 DIAGNOSIS — I359 Nonrheumatic aortic valve disorder, unspecified: Secondary | ICD-10-CM

## 2011-03-03 DIAGNOSIS — E7800 Pure hypercholesterolemia, unspecified: Secondary | ICD-10-CM

## 2011-03-03 DIAGNOSIS — E785 Hyperlipidemia, unspecified: Secondary | ICD-10-CM | POA: Insufficient documentation

## 2011-03-03 DIAGNOSIS — I2510 Atherosclerotic heart disease of native coronary artery without angina pectoris: Secondary | ICD-10-CM

## 2011-03-03 DIAGNOSIS — E119 Type 2 diabetes mellitus without complications: Secondary | ICD-10-CM | POA: Insufficient documentation

## 2011-03-03 DIAGNOSIS — I119 Hypertensive heart disease without heart failure: Secondary | ICD-10-CM | POA: Insufficient documentation

## 2011-03-03 NOTE — Assessment & Plan Note (Signed)
The patient has a long history of essential hypertension.  She has not been experiencing any headaches or dizzy spells.  She's had no syncope.  She is not experiencing any TIA symptoms.

## 2011-03-03 NOTE — Patient Instructions (Signed)
We will  Schedule you for a 2-day lexiscan myoview and an echocardiogram

## 2011-03-03 NOTE — Assessment & Plan Note (Signed)
The patient has been experiencing occasional substernal chest discomfort.  It does not radiate.  It can occur at rest or with mild exertion.  She does not do much exertion because of bad knees.  She walks with a cane.  She is also noted some shortness of breath.  She does not know when her last chest x-ray was she does have a history of a known heart murmur.  We will evaluate her further with a two-day LexiScan Myoview study.  We will also evaluate her heart murmur with echocardiogram.  We are also sending her for a PA and lateral of the chest to be sure that none of her dyspnea is on a noncardiac basis.

## 2011-03-03 NOTE — Assessment & Plan Note (Signed)
The patient has a history of dyslipidemia.  This is followed by her internist.  She is on Crestor.  She's not having any myalgias from the Crestor.

## 2011-03-03 NOTE — Progress Notes (Signed)
Erin Haynes Date of Birth:  1941-04-02 [facility] [address]   [address] [place], [place]  [address] [phone]           Fax   [phone]  History of Present Illness: This pleasant 70 year old African American woman is seen after a three-year absence.  She has a history of essential hypertension.  She has had a history of exogenous obesity.  She's had diabetes and hypercholesterolemia.  She had cardiac catheterization in 2002 showing minimal nonobstructive coronary artery disease.  She had a Cardiolite stress test on 03/02/08 which showed no ischemia and her ejection fraction was 70% she has a history of a heart murmur.  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  Her pulmonary artery pressure was normal.  Current Outpatient Prescriptions  Medication Sig Dispense Refill  . amLODipine (NORVASC) 10 MG tablet Take 10 mg by mouth daily.        Marland Kitchen aspirin 81 MG tablet Take 81 mg by mouth 2 (two) times daily.        . Calcium Carb-Cholecalciferol (CALCIUM PLUS VITAMIN D3 PO) Take by mouth. Taking 1000 daily       . Calcium Carbonate (CHEWABLE CALCIUM PO) Take by mouth. Taking 1000 daily       . Coenzyme Q10 (CO Q 10 PO) Take by mouth. Taking co q 30 daily       . EVENING PRIMROSE OIL PO Take 500 mg by mouth daily.        Marland Kitchen levothyroxine (SYNTHROID, LEVOTHROID) 137 MCG tablet Take 137 mcg by mouth daily. Taking 0.1mg  daily      . lisinopril (PRINIVIL,ZESTRIL) 20 MG tablet Take 20 mg by mouth daily.        . meclizine (ANTIVERT) 25 MG tablet Take 25 mg by mouth 3 (three) times daily as needed.        . metFORMIN (GLUCOPHAGE) 500 MG tablet Take 500 mg by mouth daily. Taking 1500 daily      . methocarbamol (ROBAXIN) 750 MG tablet Take 750 mg by mouth as needed.        . metoprolol (TOPROL-XL)  100 MG 24 hr tablet Take 100 mg by mouth daily.       . Multiple Vitamin (MULTIVITAMIN PO) Take by mouth.        . Omega-3 Fatty Acids (FISH OIL) 1200 MG CAPS Take by mouth 2 (two) times daily.        . potassium chloride SA (K-DUR,KLOR-CON) 20 MEQ tablet Take 20 mEq by mouth 2 (two) times daily.        . rosuvastatin (CRESTOR) 20 MG tablet Take 20 mg by mouth daily.        . solifenacin (VESICARE) 5 MG tablet Take 10 mg by mouth daily. As needed       . spironolactone (ALDACTONE) 25 MG tablet Take 25 mg by mouth daily.        . ST JOHNS WORT PO Take 300 mg by mouth 2 (two) times daily.        Marland Kitchen zolpidem (AMBIEN) 10 MG tablet Take 10 mg by mouth at bedtime as needed. 1/2 to one daily         Allergies  Allergen Reactions  . Other     IVP dye    Patient Active Problem List  Diagnoses  . Vertigo  . Coronary artery disease  . Osteoarthritis  . Heart palpitations  . Heart murmur  . Back pain    History  Smoking status  . Former Smoker  Smokeless tobacco  . Not on file    History  Alcohol Use     Family History  Problem Relation Age of Onset  . Heart attack Mother     age 39  . Stroke Mother   . Heart attack Father     Review of Systems: Constitutional: no fever chills diaphoresis or fatigue or change in weight.  Head and neck: no hearing loss, no epistaxis, no photophobia or visual disturbance. Respiratory: No cough, shortness of breath or wheezing. Cardiovascular: No chest pain peripheral edema, palpitations. Gastrointestinal: No abdominal distention, no abdominal pain, no change in bowel habits hematochezia or melena. Genitourinary: No dysuria, no frequency, no urgency, no nocturia. Musculoskeletal:No arthralgias, no back pain, no gait disturbance or myalgias. Neurological: No dizziness, no headaches, no numbness, no seizures, no syncope, no weakness, no tremors. Hematologic: No lymphadenopathy, no easy bruising. Psychiatric: No confusion, no hallucinations, no  sleep disturbance.    Physical Exam: Filed Vitals:   03/03/11 1535  BP: 140/80  Pulse: 70  The general appearance reveals a large African American woman in no acute distressPupils equal and reactive.   Extraocular Movements are full.  There is no scleral icterus.  The mouth and pharynx are normal.  The neck is supple.  The carotids reveal no bruits.  The jugular venous pressure is normal.  The thyroid is not enlarged.  There is no lymphadenopathy.  The chest is clear to percussion and auscultation. There are no rales or rhonchi. Expansion of the chest is symmetrical.   the heart reveals a grade 2/6 systolic ejection murmur at the base.  No diastolic murmur.  No gallop or rub.The abdomen is soft and nontender. Bowel sounds are normal. The liver and spleen are not enlarged. There Are no abdominal masses. There are no bruits.  The pedal pulses are good.  There is no phlebitis or edema.  There is no cyanosis or clubbing.  Strength is normal and symmetrical in all extremities.  There is no lateralizing weakness.  There are no sensory deficits.She has poor balance and does walk with a cane.The skin is warm and dry.  There is no rash.       Assessment / Plan:  Further evaluation of chest pain and dyspnea with q. Day LexiScan Myoview and with an echocardiogram.  Also she will get a chest x-ray.

## 2011-03-03 NOTE — Assessment & Plan Note (Signed)
The patient has a history of adult onset diabetes mellitus.  She is on metformin.  She denies any hypoglycemic episodes.  Her diabetes is followed closely by her internist

## 2011-03-10 ENCOUNTER — Encounter: Payer: Self-pay | Admitting: Cardiology

## 2011-03-12 ENCOUNTER — Encounter: Payer: Self-pay | Admitting: *Deleted

## 2011-03-17 ENCOUNTER — Ambulatory Visit: Payer: Self-pay | Admitting: Cardiology

## 2011-03-19 ENCOUNTER — Other Ambulatory Visit (HOSPITAL_COMMUNITY): Payer: Medicare Other | Admitting: Radiology

## 2011-03-26 ENCOUNTER — Ambulatory Visit (HOSPITAL_COMMUNITY): Payer: Medicare Other | Attending: Cardiology | Admitting: Radiology

## 2011-03-26 ENCOUNTER — Ambulatory Visit (HOSPITAL_BASED_OUTPATIENT_CLINIC_OR_DEPARTMENT_OTHER): Payer: Medicare Other | Admitting: Radiology

## 2011-03-26 DIAGNOSIS — I4949 Other premature depolarization: Secondary | ICD-10-CM

## 2011-03-26 DIAGNOSIS — R0602 Shortness of breath: Secondary | ICD-10-CM

## 2011-03-26 DIAGNOSIS — R011 Cardiac murmur, unspecified: Secondary | ICD-10-CM

## 2011-03-26 DIAGNOSIS — I358 Other nonrheumatic aortic valve disorders: Secondary | ICD-10-CM

## 2011-03-26 DIAGNOSIS — R079 Chest pain, unspecified: Secondary | ICD-10-CM

## 2011-03-26 MED ORDER — TECHNETIUM TC 99M TETROFOSMIN IV KIT
33.0000 | PACK | Freq: Once | INTRAVENOUS | Status: AC | PRN
  Administered 2011-03-26: 33 via INTRAVENOUS

## 2011-03-26 MED ORDER — REGADENOSON 0.4 MG/5ML IV SOLN
0.4000 mg | Freq: Once | INTRAVENOUS | Status: AC
  Administered 2011-03-26: 0.4 mg via INTRAVENOUS

## 2011-03-26 NOTE — Progress Notes (Signed)
[facility] SITE 3 NUCLEAR MED [address] [place] [address] [phone]  Cardiology Nuclear Med Study  Erin Haynes is a 70 y.o. female [id number] 10-Aug-1940   Nuclear Med Background Indication for Stress Test:  Evaluation for Ischemia History: 03/08 Echo: NL LVF , '02 Heart Catheterization: N/O Dz EF 80% and 09/09 Myocardial Perfusion Study: EF 70% Cardiac Risk Factors: Carotid Disease, Family History - CAD, History of Smoking, Hypertension, Lipids and NIDDM  Symptoms:  Chest Pain, Dizziness, Palpitations and SOB   Nuclear Pre-Procedure Caffeine/Decaff Intake:  None NPO After: 10:00pm   Lungs:  clear IV 0.9% NS with Angio Cath:  20g  IV Site: R Antecubital x 1, tolerated well IV Started by:  Irean Hong, RN  Chest Size (in):  46 Cup Size: C  Height: 5\' 6"  (1.676 m)  Weight:  245 lb (111.131 kg)  BMI:  Body mass index is 39.54 kg/(m^2). Tech Comments:  Held toprol and metformin today    Nuclear Med Study 1 or 2 day study: 2 day  Stress Test Type:  Lexiscan  Reading MD: Gunnar Fusi Raelynne Ludwick,M.D. Order Authorizing Provider:  Cassell Clement, MD  Resting Radionuclide: Technetium 49m Tetrofosmin  Resting Radionuclide Dose: 33.0 mCi   Stress Radionuclide:  Technetium 59m Tetrofosmin  Stress Radionuclide Dose: 33.0 mCi           Stress Protocol Rest HR: 71 Stress HR: 100  Rest BP: 157/75 Stress BP: 172/78  Exercise Time (min): n/a METS: n/a   Predicted Max HR: 150 bpm % Max HR: 66.67 bpm Rate Pressure Product: 21308   Dose of Adenosine (mg):  n/a Dose of Lexiscan: 0.4 mg  Dose of Atropine (mg): n/a Dose of Dobutamine: n/a mcg/kg/min (at max HR)  Stress Test Technologist: Milana Na, EMT-P  Nuclear Technologist:  Domenic Polite, CNMT     Rest Procedure:  Myocardial perfusion imaging was performed at rest 45 minutes following the intravenous administration of Technetium 3m Tetrofosmin. Rest ECG: NSR  Stress Procedure:  The patient received  IV Lexiscan 0.4 mg over 15-seconds.  Technetium 23m Tetrofosmin injected at 30-seconds.  There were no significant changes, + sob, lt, headed, woozy, and rare pvcs with Lexiscan.  Quantitative spect images were obtained after a 45 minute delay. Stress ECG: No significant change from baseline ECG  QPS Raw Data Images:  Images were motion corrected.  Soft tissue (diaphragm, breast tissue) surround heart. Stress Images:  Normal homogeneous uptake in all areas of the myocardium. Rest Images:  Normal homogeneous uptake in all areas of the myocardium. Subtraction (SDS):  No reversibility is appreciated. Transient Ischemic Dilatation (Normal <1.22):  0.96 Lung/Heart Ratio (Normal <0.45):  0.26  Quantitative Gated Spect Images QGS EDV:  77 ml QGS ESV:  23 ml QGS cine images:  NL LV Function; NL Wall Motion QGS EF: 71%  Impression Exercise Capacity:  Lexiscan with no exercise. BP Response:  Normal blood pressure response. Clinical Symptoms:  No chest pain. ECG Impression:  No significant ST segment change suggestive of ischemia. Comparison with Prior Nuclear Study: No significant change from previous study  Overall Impression:  Normal stress nuclear study.  LVEF 71% Dietrich Pates

## 2011-03-27 ENCOUNTER — Other Ambulatory Visit: Payer: Self-pay | Admitting: *Deleted

## 2011-03-27 DIAGNOSIS — I359 Nonrheumatic aortic valve disorder, unspecified: Secondary | ICD-10-CM

## 2011-03-27 DIAGNOSIS — Z01812 Encounter for preprocedural laboratory examination: Secondary | ICD-10-CM

## 2011-03-28 ENCOUNTER — Telehealth: Payer: Self-pay | Admitting: Cardiology

## 2011-03-28 NOTE — Telephone Encounter (Signed)
Pt had echo and myoview done and per pt a technician call her when she was on her way out to  A funeral and told her she needed additional testing and that Dr. Patty Sermons was out of the office and she dosen't want to hear from a tech she wants to know when Dr. Patty Sermons will be back and she wants to know what test and why she needs the test and she wants to talk to a Doctor not a tech telling her nothing.

## 2011-03-28 NOTE — Telephone Encounter (Signed)
I told pt from what I can tell, she needs a TEE ordered but I would have Dr Yevonne Pax nurse call her and verify exactly what she needs.

## 2011-03-28 NOTE — Telephone Encounter (Signed)
Pt states she will be in the office for a test on Monday and would like to talk to Dr Yevonne Pax nurse at that time.  She states she is unsure exactly what test and why and didn't realize that she was talking to Dr Yevonne Pax nurse at the time of the conversation.

## 2011-03-31 ENCOUNTER — Ambulatory Visit (HOSPITAL_COMMUNITY): Payer: Medicare Other | Attending: Cardiology | Admitting: Radiology

## 2011-03-31 ENCOUNTER — Other Ambulatory Visit (INDEPENDENT_AMBULATORY_CARE_PROVIDER_SITE_OTHER): Payer: Medicare Other | Admitting: *Deleted

## 2011-03-31 DIAGNOSIS — I359 Nonrheumatic aortic valve disorder, unspecified: Secondary | ICD-10-CM

## 2011-03-31 DIAGNOSIS — R0989 Other specified symptoms and signs involving the circulatory and respiratory systems: Secondary | ICD-10-CM

## 2011-03-31 DIAGNOSIS — I2510 Atherosclerotic heart disease of native coronary artery without angina pectoris: Secondary | ICD-10-CM

## 2011-03-31 DIAGNOSIS — Z01812 Encounter for preprocedural laboratory examination: Secondary | ICD-10-CM

## 2011-03-31 LAB — PROTIME-INR: Prothrombin Time: 11.5 s (ref 10.2–12.4)

## 2011-03-31 LAB — CBC WITH DIFFERENTIAL/PLATELET
Basophils Absolute: 0 10*3/uL (ref 0.0–0.1)
Eosinophils Absolute: 0.1 10*3/uL (ref 0.0–0.7)
MCHC: 33.5 g/dL (ref 30.0–36.0)
MCV: 85.9 fl (ref 78.0–100.0)
Monocytes Absolute: 0.5 10*3/uL (ref 0.1–1.0)
Neutrophils Relative %: 50.6 % (ref 43.0–77.0)
Platelets: 272 10*3/uL (ref 150.0–400.0)

## 2011-03-31 LAB — BASIC METABOLIC PANEL
BUN: 16 mg/dL (ref 6–23)
CO2: 34 mEq/L — ABNORMAL HIGH (ref 19–32)
Chloride: 98 mEq/L (ref 96–112)
Creatinine, Ser: 1 mg/dL (ref 0.4–1.2)

## 2011-03-31 MED ORDER — TECHNETIUM TC 99M TETROFOSMIN IV KIT
33.0000 | PACK | Freq: Once | INTRAVENOUS | Status: AC | PRN
  Administered 2011-03-31: 33 via INTRAVENOUS

## 2011-03-31 NOTE — Telephone Encounter (Signed)
Spoke with patient and she is going to have her TEE on Wednesday

## 2011-04-01 ENCOUNTER — Telehealth: Payer: Self-pay | Admitting: Cardiology

## 2011-04-01 NOTE — Telephone Encounter (Signed)
Pt called she wants to talk to you about procedure she is to have tomorrow

## 2011-04-01 NOTE — Telephone Encounter (Signed)
Left message

## 2011-04-02 ENCOUNTER — Ambulatory Visit (HOSPITAL_COMMUNITY)
Admission: RE | Admit: 2011-04-02 | Discharge: 2011-04-02 | Disposition: A | Discharge status: Home / Self Care | Payer: Medicare Other | Source: Ambulatory Visit | Attending: Cardiology | Admitting: Cardiology

## 2011-04-02 DIAGNOSIS — I059 Rheumatic mitral valve disease, unspecified: Secondary | ICD-10-CM | POA: Insufficient documentation

## 2011-04-02 DIAGNOSIS — R9431 Abnormal electrocardiogram [ECG] [EKG]: Secondary | ICD-10-CM

## 2011-04-02 DIAGNOSIS — R222 Localized swelling, mass and lump, trunk: Secondary | ICD-10-CM | POA: Insufficient documentation

## 2011-04-02 LAB — GLUCOSE, CAPILLARY: Glucose-Capillary: 121 mg/dL — ABNORMAL HIGH (ref 70–99)

## 2011-04-02 NOTE — Telephone Encounter (Signed)
Called patient this am and discussed procedure.  Concerned about blood sugar and not eating.  Advised they could monitor that while she is there.  Encouraged to discuss with them when she gets there today

## 2011-04-03 ENCOUNTER — Encounter: Payer: Self-pay | Admitting: *Deleted

## 2011-04-03 ENCOUNTER — Telehealth: Payer: Self-pay | Admitting: Cardiology

## 2011-04-03 NOTE — Telephone Encounter (Signed)
Scheduled appointment for Monday to discuss TEE findings

## 2011-04-03 NOTE — Telephone Encounter (Signed)
Pt said she needs you to call her today about the TEE  Test she had done

## 2011-04-07 ENCOUNTER — Encounter: Payer: Self-pay | Admitting: Cardiology

## 2011-04-07 ENCOUNTER — Telehealth: Payer: Self-pay | Admitting: *Deleted

## 2011-04-07 ENCOUNTER — Ambulatory Visit (INDEPENDENT_AMBULATORY_CARE_PROVIDER_SITE_OTHER): Payer: Medicare Other | Admitting: Cardiology

## 2011-04-07 VITALS — BP 136/80 | HR 80 | Ht 66.0 in | Wt 244.0 lb

## 2011-04-07 DIAGNOSIS — E119 Type 2 diabetes mellitus without complications: Secondary | ICD-10-CM

## 2011-04-07 DIAGNOSIS — R011 Cardiac murmur, unspecified: Secondary | ICD-10-CM

## 2011-04-07 DIAGNOSIS — I119 Hypertensive heart disease without heart failure: Secondary | ICD-10-CM

## 2011-04-07 DIAGNOSIS — D151 Benign neoplasm of heart: Secondary | ICD-10-CM

## 2011-04-07 DIAGNOSIS — I2510 Atherosclerotic heart disease of native coronary artery without angina pectoris: Secondary | ICD-10-CM

## 2011-04-07 NOTE — Patient Instructions (Signed)
Your physician recommends that you continue on your current medications as directed. Please refer to the Current Medication list given to you today.  We will contact surgeon and call you with an appointment as discussed with  Dr. Patty Sermons.

## 2011-04-07 NOTE — Progress Notes (Signed)
Advised ok  

## 2011-04-07 NOTE — Telephone Encounter (Signed)
Message copied by Burnell Blanks on Mon Apr 07, 2011 11:07 AM ------      Message from: Cassell Clement      Created: Sun Apr 06, 2011  3:42 PM       The stress test was normal.  I will also mention this when I see her for her office visit and when I talk to her about a TEE for her abnormal echo finding

## 2011-04-07 NOTE — Progress Notes (Signed)
Patient had TEE and is seeing  Dr. Patty Sermons on 10/29

## 2011-04-07 NOTE — Telephone Encounter (Signed)
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  She has an office visit appointment on October 29.  We will discuss further evaluation with a TEE.

## 2011-04-07 NOTE — Progress Notes (Signed)
Erin Haynes Date of Birth:  [birth year] [facility] / [facility] [address]   [address] [place], [place]  [address] [phone]           Fax   [phone]  History of Present Illness: This pleasant 70 year old American female is seen for a work in followup office visit.  We had seen her several weeks ago, which point she was complaining of some atypical right-sided chest pain, which would be relieved if she would pound on her anterior chest once or twice.  She does have risk factors for coronary disease including hypertension, and diabetes, and we proceeded with a nuclear stress test, which was negative for myocardial ischemia.  Because of a very slight heart murmur.  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  The myxoma was confirmed by TEE.  The myxoma is bilobed and is not close to the mitral valve and does not impede blood flow into the mitral valve orifice.  The patient has not had any symptoms referable to the eczema.  She has not had any platypnea or orthopnea or increased exertional dyspnea.  Current Outpatient Prescriptions  Medication Sig Dispense Refill  . amLODipine (NORVASC) 10 MG tablet Take 10 mg by mouth daily.        Marland Kitchen aspirin 81 MG tablet Take 81 mg by mouth 2 (two) times daily.        . Calcium Carb-Cholecalciferol (CALCIUM PLUS VITAMIN D3 PO) Take by mouth. Taking 1000 daily       . Calcium Carbonate (CHEWABLE CALCIUM PO) Take by mouth. Taking 1000 daily       . Coenzyme Q10 (CO Q 10 PO) Take by mouth. Taking co q 30 daily       . EVENING PRIMROSE OIL PO Take 500 mg by mouth daily.        . Ginkgo Biloba (GINKOBA PO) Take by mouth. Takes with St. Johns wort       . levothyroxine (SYNTHROID, LEVOTHROID) 137 MCG tablet Take 137 mcg by mouth daily. Taking 0.1mg  daily      . lisinopril (PRINIVIL,ZESTRIL) 20 MG tablet Take 20 mg by mouth 2 (two) times daily.       . meclizine (ANTIVERT) 25 MG tablet Take 25 mg by mouth 3  (three) times daily as needed.        . metFORMIN (GLUCOPHAGE) 500 MG tablet Take 500 mg by mouth daily. Taking 1500 daily      . methocarbamol (ROBAXIN) 750 MG tablet Take 750 mg by mouth as needed.        . metoprolol (TOPROL-XL) 100 MG 24 hr tablet Take 100 mg by mouth daily.       . Multiple Vitamin (MULTIVITAMIN PO) Take by mouth.        . Omega-3 Fatty Acids (FISH OIL) 1200 MG CAPS Take by mouth 2 (two) times daily. 1500 daily      . potassium chloride SA (K-DUR,KLOR-CON) 20 MEQ tablet Take 20 mEq by mouth 2 (two) times daily.        . rosuvastatin (CRESTOR) 20 MG tablet Take 20 mg by mouth daily.        . solifenacin (VESICARE) 5 MG tablet Take 10 mg by mouth daily. As needed       . spironolactone (ALDACTONE) 25 MG tablet Take 25 mg by mouth daily.        . ST JOHNS WORT PO Take 300 mg by mouth  2 (two) times daily.        Marland Kitchen zolpidem (AMBIEN) 10 MG tablet Take 10 mg by mouth at bedtime as needed. 1/2 to one daily         Allergies  Allergen Reactions  . Ivp Dye (Iodinated Diagnostic Agents) Anaphylaxis    Throat and face swelling    Patient Active Problem List  Diagnoses  . Vertigo  . Coronary artery disease  . Osteoarthritis  . Heart palpitations  . Heart murmur  . Back pain  . Dyslipidemia  . Diabetes mellitus  . 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 Myxoma of heart    History  Smoking status  . Former Smoker  Smokeless tobacco  . Not on file    History  Alcohol Use     Family History  Problem Relation Age of Onset  . Heart attack Mother     age 50  . Stroke Mother   . Heart attack Father     Review of Systems: Constitutional: no fever chills diaphoresis or fatigue or change in weight.  Head and neck: no hearing loss, no epistaxis, no photophobia or visual disturbance. Respiratory: No cough, shortness of breath or wheezing. Cardiovascular: No chest pain peripheral edema, palpitations. Gastrointestinal: No abdominal distention, no  abdominal pain, no change in bowel habits hematochezia or melena. Genitourinary: No dysuria, no frequency, no urgency, no nocturia. Musculoskeletal:No arthralgias, no back pain, no gait disturbance or myalgias. Neurological: No dizziness, no headaches, no numbness, no seizures, no syncope, no weakness, no tremors. Hematologic: No lymphadenopathy, no easy bruising. Psychiatric: No confusion, no hallucinations, no sleep disturbance.    Physical Exam: Filed Vitals:   04/07/11 1550  BP: 136/80  Pulse: 80   general appearance reveals a well-developed, well-nourished, large woman, in no distress.Pupils equal and reactive.   Extraocular Movements are full.  There is no scleral icterus.  The mouth and pharynx are normal.  The neck is supple.  The carotids reveal no bruits.  The jugular venous pressure is normal.  The thyroid is not enlarged.  There is no lymphadenopathy.  The chest is clear to percussion and auscultation. There are no rales or rhonchi. Expansion of the chest is symmetrical.  The heart reveals a faint systolic ejection murmur at the base.  No gallop, or rub.The abdomen is soft and nontender. Bowel sounds are normal. The liver and spleen are not enlarged. There Are no abdominal masses. There are no bruits.  The pedal pulses are good.  There is no phlebitis or edema.  There is no cyanosis or clubbing. Strength is normal and symmetrical in all extremities.  There is no lateralizing weakness.  There are no sensory deficits.  The skin is warm and dry.  There is no rash.    Assessment / Plan:  I showed [name] her TEE images, which shows the left atrial myxoma.  She understands the importance of surgery as the only treatment for this.  Fortunately, at this point, she has not had any symptoms of transient ischemic attack or stroke.  He is agreeable to proceeding with surgery as quickly as possible and she will postpone her cataract surgery, which was scheduled for early November.   We have called the cardiac surgeons and an appointment with Dr. Laneta Simmers Is being arranged.

## 2011-04-07 NOTE — Assessment & Plan Note (Signed)
The patient has had a history of mild diabetes mellitus.  She is not having any hypoglycemic events.  Recent blood sugars here in the office have ranged from 121 to 138.

## 2011-04-07 NOTE — Assessment & Plan Note (Signed)
Patient was recently found to have a left atrial myxoma.  It is attached to the left side of the interatrial septum.  It is a bilobed mass measuring 3.5 x 3.5 x 4 cm.  There was no evidence of any thrombus.  Of note, is the fact that the patient has had a soft heart murmur when she was a young woman and for that reason.  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

## 2011-04-07 NOTE — Telephone Encounter (Signed)
Spoke with patient on 03/27/11 and had TEE on 10/24.  Has office visit on 10/29 with  Dr. Patty Sermons to discuss surgery

## 2011-04-07 NOTE — Assessment & Plan Note (Signed)
The patient has not been experiencing any exertional chest discomfort to suggest angina pectoris.  2002.  The patient had an abnormal adenosine study, which led to a cardiac catheterization by Dr. [place] on 12/11/00.  At catheter she was found to have a ejection fraction of 80% and normal wall motion and minimal nonobstructive atherosclerotic coronary artery disease with normal LAD, a large diagonal branch, which had mild disease of less than 30%, and a normal circumflex, and a right coronary artery, which was codominant with a 10% irregularity in the proximal vessel.  The patient had a recent CT scan.  Myoview stress test on 04/01/11, which showed an ejection fraction of 71% and normal wall motion and no evidence of ischemia and was a normal study

## 2011-04-07 NOTE — Telephone Encounter (Signed)
Advised patient stress test ok when I spoke with her on 10/25 and scheduled her office visit for 10/29

## 2011-04-10 DIAGNOSIS — T8859XA Other complications of anesthesia, initial encounter: Secondary | ICD-10-CM

## 2011-04-10 HISTORY — DX: Other complications of anesthesia, initial encounter: T88.59XA

## 2011-04-10 HISTORY — PX: OTHER SURGICAL HISTORY: SHX169

## 2011-04-11 ENCOUNTER — Institutional Professional Consult (permissible substitution) (INDEPENDENT_AMBULATORY_CARE_PROVIDER_SITE_OTHER): Payer: Medicare Other | Admitting: Thoracic Surgery (Cardiothoracic Vascular Surgery)

## 2011-04-11 ENCOUNTER — Telehealth: Payer: Self-pay | Admitting: Cardiology

## 2011-04-11 ENCOUNTER — Telehealth: Payer: Self-pay | Admitting: *Deleted

## 2011-04-11 ENCOUNTER — Other Ambulatory Visit: Payer: Self-pay | Admitting: Thoracic Surgery (Cardiothoracic Vascular Surgery)

## 2011-04-11 ENCOUNTER — Encounter: Payer: Self-pay | Admitting: Thoracic Surgery (Cardiothoracic Vascular Surgery)

## 2011-04-11 ENCOUNTER — Ambulatory Visit
Admission: RE | Admit: 2011-04-11 | Discharge: 2011-04-11 | Disposition: A | Discharge status: Home / Self Care | Payer: Medicare Other | Source: Ambulatory Visit | Attending: Cardiology | Admitting: Cardiology

## 2011-04-11 ENCOUNTER — Encounter: Payer: Self-pay | Admitting: *Deleted

## 2011-04-11 ENCOUNTER — Ambulatory Visit (INDEPENDENT_AMBULATORY_CARE_PROVIDER_SITE_OTHER): Payer: Medicare Other | Admitting: *Deleted

## 2011-04-11 VITALS — BP 153/79 | HR 64 | Resp 16 | Ht 66.0 in | Wt 242.0 lb

## 2011-04-11 DIAGNOSIS — D492 Neoplasm of unspecified behavior of bone, soft tissue, and skin: Secondary | ICD-10-CM

## 2011-04-11 DIAGNOSIS — D151 Benign neoplasm of heart: Secondary | ICD-10-CM

## 2011-04-11 DIAGNOSIS — I119 Hypertensive heart disease without heart failure: Secondary | ICD-10-CM

## 2011-04-11 DIAGNOSIS — I2510 Atherosclerotic heart disease of native coronary artery without angina pectoris: Secondary | ICD-10-CM

## 2011-04-11 LAB — PROTIME-INR
INR: 1 ratio (ref 0.8–1.0)
Prothrombin Time: 11.5 s (ref 10.2–12.4)

## 2011-04-11 LAB — BASIC METABOLIC PANEL
Chloride: 102 mEq/L (ref 96–112)
Creatinine, Ser: 1 mg/dL (ref 0.4–1.2)
GFR: 68.01 mL/min (ref >60.00)

## 2011-04-11 LAB — CBC WITH DIFFERENTIAL/PLATELET
Basophils Absolute: 0 10*3/uL (ref 0.0–0.1)
Hemoglobin: 13 g/dL (ref 12.0–15.0)
Lymphocytes Relative: 37.1 % (ref 12.0–46.0)
Monocytes Relative: 6.7 % (ref 3.0–12.0)
Neutro Abs: 3.4 10*3/uL (ref 1.4–7.7)
Neutrophils Relative %: 53.4 % (ref 43.0–77.0)
Platelets: 258 10*3/uL (ref 150.0–400.0)
RDW: 14.2 % (ref 11.5–14.6)

## 2011-04-11 MED ORDER — PREDNISONE 20 MG PO TABS: ORAL_TABLET | ORAL | Status: DC

## 2011-04-11 NOTE — Telephone Encounter (Signed)
Had had questions re cath and having someone with her.  Explained everything again. Verbalizes understanding.

## 2011-04-11 NOTE — Telephone Encounter (Signed)
Will be coming from Dr. Orvan July office for pre cath labs today

## 2011-04-11 NOTE — Telephone Encounter (Signed)
Pt has questions regarding procedure she is to have done Tuesday

## 2011-04-11 NOTE — Progress Notes (Signed)
PCP is No primary provider on file.Erin Dills,  MD Referring Provider is Cassell Clement, MD  Chief Complaint  Patient presents with  . Atrial Mass    HPI: Patient is a 70 year old African American female with history of hypertension, type 2 diabetes mellitus, and hyperlipidemia. She has had multiple previous surgical procedures in the past for a variety of orthopedic problems. Because of her associated risk factors, she was evaluated many years ago for the possible presence of coronary artery disease and she underwent cardiac catheterization demonstrating mild nonobstructive coronary artery disease in 2002. She also has history of a heart murmur which was originally picked up at age 52. She has been followed for many years by Dr. Patty Sermons. However, she has not seen Dr. Patty Sermons since 2008. Over the last year and a half or so the patient has developed progressive sensation of generalized fatigue as well as worsening exertional shortness of breath. She has atypical chest pain and occasional palpitations. She was recently seen in followup by Dr. Patty Sermons, and a stress test was performed that was felt to be low risk for ischemic heart disease. She also had a 2-D echocardiogram performed 03/26/2011. 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 No other significant abnormalities were noted. The patient has been referred for elective surgical intervention.   Past Medical History  Diagnosis Date  . Thyroid disease   . Hyperlipidemia   . Hypertension   . Diabetes mellitus   . Vertigo   . Coronary artery disease     minimal nonobstructive  . Osteoarthritis   . Heart palpitations     hx of  .  Heart murmur     age 20  . Back pain     Past Surgical History  Procedure Date  . Cardiac catheterization 2002    minimal nonobstructive CAD  . Total knee arthroplasty     right and left  . Other surgical history     hysterectomy,fibroid tumors    Family History  Problem Relation Age of Onset  . Heart attack Mother     age 21  . Stroke Mother   . Heart attack Father     Social History History  Substance Use Topics  . Smoking status: Former Smoker    Quit date: 06/10/1990  . Smokeless tobacco: Never Used  . Alcohol Use: Not on file    Current Outpatient Prescriptions  Medication Sig Dispense Refill  . amLODipine (NORVASC) 10 MG tablet Take 10 mg by mouth daily.        Marland Kitchen aspirin 81 MG tablet Take 81 mg by mouth 2 (two) times daily.        . calcium-vitamin D (OSCAL WITH D) 500-200 MG-UNIT per tablet Take 2 tablets by mouth daily. Chewable       . Coenzyme Q10 (CO Q 10 PO) Take by mouth. Taking co q 30 daily       . EVENING PRIMROSE OIL PO Take 500 mg by mouth daily.        . Ginkgo Biloba (GINKOBA PO) Take by mouth. Takes with St. Johns wort       . levothyroxine (SYNTHROID, LEVOTHROID) 137 MCG tablet Take 137 mcg by mouth daily. Taking 0.1mg  daily      .  lisinopril (PRINIVIL,ZESTRIL) 20 MG tablet Take 20 mg by mouth 2 (two) times daily.       . meclizine (ANTIVERT) 25 MG tablet Take 25 mg by mouth 3 (three) times daily as needed.        . metFORMIN (GLUCOPHAGE) 500 MG tablet Take 500 mg by mouth daily. Taking 1500 daily      . methocarbamol (ROBAXIN) 750 MG tablet Take 750 mg by mouth as needed.        . metoprolol (TOPROL-XL) 100 MG 24 hr tablet Take 100 mg by mouth daily.       . Multiple Vitamin (MULTIVITAMIN PO) Take by mouth.        . Omega-3 Fatty Acids (FISH OIL) 1200 MG CAPS Take by mouth 2 (two) times daily. 1500 daily      . potassium chloride SA (K-DUR,KLOR-CON) 20 MEQ tablet Take 20 mEq by mouth 2 (two) times daily.        . rosuvastatin (CRESTOR) 20 MG  tablet Take 20 mg by mouth daily.        . solifenacin (VESICARE) 5 MG tablet Take 10 mg by mouth daily. As needed       . spironolactone (ALDACTONE) 25 MG tablet Take 25 mg by mouth daily.        . ST JOHNS WORT PO Take 300 mg by mouth 2 (two) times daily.        Marland Kitchen zolpidem (AMBIEN) 10 MG tablet Take 10 mg by mouth at bedtime as needed. 1/2 to one daily       . predniSONE (DELTASONE) 20 MG tablet Take 3 tablets Monday night (total of 60 mg) and 3 tablets Tuesday morning about 10:30 prior to procedure  6 tablet  0    Allergies  Allergen Reactions  . Ivp Dye (Iodinated Diagnostic Agents) Anaphylaxis    Throat and face swelling    Review of Systems: The patient complains of chronic fatigue and a sensation of feeling tired. She says this seems to have gradually progressed over the last year and a half or so. She is 5 foot 6 inches tall and weighs 242 pounds. She reports that this is more or less stable. The patient has symptoms of exertional shortness of breath. She denies resting shortness of breath, PND, orthopnea, syncope. She has occasional dizzy spells an occasional palpitations. She has occasional fleeting chest pains that are atypical and not necessarily related to physical activity. The patient denies productive cough, hemoptysis, wheezing. She has no difficulty swallowing. She has normal bowel function. She denies any history of hematochezia, hematemesis, melena.. The patient has no difficulty urinating. She does urinate frequently but this is particularly associated with diuretic medication. She denies any recent urinary infections. The patient denies pain in her legs with ambulation suggestive of claudication. Patient does have chronic pain in her right knee related to degenerative changes with history of previous right total knee replacement approximately 16 years ago. She also has some chronic pain and weakness in her back. Because of this she walks using a walker and get start easily. The  patient also has some history of depression recently. She has no ongoing dental issues and reports no loose teeth and painful teeth. She recently had right cataract extraction and she reports that she's needs to have a left cataract removed as well but this has been postponed. The remainder of her review of systems unrevealing.  BP 153/79  Pulse 64  Resp 16  Ht 5\' 6"  (1.676  m)  Wt 242 lb (109.77 kg)  BMI 39.06 kg/m2  SpO2 96% Physical Exam: Patient is a well-appearing obese African American female who appears his stated age in no acute distress. HEENT exam is unrevealing. The neck is supple. There are no carotid bruits. There is no palpable lymphadenopathy. Auscultation of the chest demonstrates clear breath sounds which are symmetrical bilaterally. No wheezes rales or rhonchi are noted. Neurovascular exam is notable for regular rate and rhythm. No murmurs rubs or gallops are appreciated. The abdomen is soft nontender. There no obvious masses. Bowel sounds are present. Extremities warm and well-perfused. There is no lower extremity edema. Distal pulses are not palpable in either lower leg at the ankle. Femoral pulses are diminished. Rectal and GU exams are both deferred. Neurologic examination is grossly nonfocal and symmetrical bilaterally. The remainder of her physical exam is noncontributory.  Diagnostic Tests: Transesophageal echocardiogram performed 04/02/2011 is reviewed. This demonstrates a large bilobed mass in the left atrium that emanates from a stalk attached to the intra-atrial septum. Characteristics of the mass or classical for left atrial myxoma. There is trivial associated mitral regurgitation. Left ventricular size and function appear normal. No other abnormalities are noted.  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 I feel that she needs cardiac catheterization prior to surgery. In the absence of coronary artery disease  she might be candidate for mini thoracotomy approach for surgical resection of her atrial myxoma. However, she may also have significant aortoiliac disease as her femoral pulses seem diminished on exam. We will ask to image the descending thoracic and abdominal aorta and iliac vessels at the time of catheterization.  Plan: I've discussed matters at length with the patient here in the office today. She came in alone for consultation today. She is single and lives alone.  She has one son who lives in [place].  She does not have any other family nearby, but she reports having numerous close friends.  She will certainly need assistance during her recovery following surgery, particularly considering that her mobility is somewhat limited at baseline. We discussed the indications, risks, and potential benefits of resection of her left atrial mass. We discussed surgical alternatives and in particular we discussed the possibility of approaching her surgery to the right mini thoracotomy approach if she does not have significant coronary artery disease or significant aortoiliac occlusive disease. We discussed all potential associated risks of surgery including but not limited to risk of death, stroke, myocardial infarction, congestive heart failure, respiratory failure, renal failure, bleeding requiring blood transfusion, arrhythmia, infection, late recurrence of atrial myxoma. We particularly discussed the fairly high associated risk of postoperative atrial arrhythmias including atrial fibrillation and atrial flutter. Because of her history of IV contrast allergy, I will not plan to administer amiodarone preoperatively as we might otherwise. The patient has been scheduled for cardiac catheterization on November 6. We will tentatively plan to proceed with surgery on Thursday, November 8. We discussed expectations with surgery as well as her postoperative recovery and convalescence at length. All of her questions been  addressed.

## 2011-04-11 NOTE — Telephone Encounter (Signed)
Message copied by Burnell Blanks on Fri Apr 11, 2011  5:53 PM ------      Message from: Cassell Clement      Created: Fri Apr 11, 2011  1:20 PM       The chest x-ray is normal.  Please report

## 2011-04-11 NOTE — Telephone Encounter (Signed)
Came by office to get labs. Gave instructions for cath on Tues in [facility]. Also will need to go get CXR today since she didn't go on 9/25. Also has an allergy to dye and will premedicate w/Prednisone. Sent Rx into [facility].

## 2011-04-11 NOTE — Telephone Encounter (Signed)
Advised of chest xray results 

## 2011-04-11 NOTE — Telephone Encounter (Signed)
Pt has questions regarding procedure she is to have Tuesday

## 2011-04-12 NOTE — Progress Notes (Signed)
Erin Haynes, please make sure that cath is set up for Tuesday afternoon.  She has a contrast allergy and will need to be premedicated.

## 2011-04-14 ENCOUNTER — Other Ambulatory Visit: Payer: Self-pay | Admitting: Thoracic Surgery (Cardiothoracic Vascular Surgery)

## 2011-04-14 ENCOUNTER — Other Ambulatory Visit (HOSPITAL_COMMUNITY): Payer: Medicare Other

## 2011-04-14 ENCOUNTER — Other Ambulatory Visit: Payer: Self-pay

## 2011-04-14 ENCOUNTER — Encounter (HOSPITAL_COMMUNITY): Payer: Self-pay

## 2011-04-14 DIAGNOSIS — D492 Neoplasm of unspecified behavior of bone, soft tissue, and skin: Secondary | ICD-10-CM

## 2011-04-14 NOTE — Progress Notes (Signed)
I talked with pt and she is aware she should take prednisone 60mg  tonight and in the morning.

## 2011-04-14 NOTE — Progress Notes (Signed)
Pt is scheduled for 04/15/11 at 12:30pm.

## 2011-04-15 ENCOUNTER — Inpatient Hospital Stay (HOSPITAL_BASED_OUTPATIENT_CLINIC_OR_DEPARTMENT_OTHER)
Admission: RE | Admit: 2011-04-15 | Discharge: 2011-04-15 | Disposition: A | Discharge status: Home / Self Care | Payer: Medicare Other | Source: Ambulatory Visit | Attending: Cardiology | Admitting: Cardiology

## 2011-04-15 ENCOUNTER — Other Ambulatory Visit (HOSPITAL_COMMUNITY): Payer: Medicare Other

## 2011-04-15 ENCOUNTER — Other Ambulatory Visit: Payer: Self-pay

## 2011-04-15 ENCOUNTER — Encounter (HOSPITAL_BASED_OUTPATIENT_CLINIC_OR_DEPARTMENT_OTHER)
Admission: RE | Disposition: A | Discharge status: Home / Self Care | Payer: Self-pay | Source: Ambulatory Visit | Attending: Cardiology

## 2011-04-15 ENCOUNTER — Encounter (HOSPITAL_COMMUNITY): Payer: Medicare Other

## 2011-04-15 DIAGNOSIS — I739 Peripheral vascular disease, unspecified: Secondary | ICD-10-CM

## 2011-04-15 DIAGNOSIS — I2510 Atherosclerotic heart disease of native coronary artery without angina pectoris: Secondary | ICD-10-CM | POA: Insufficient documentation

## 2011-04-15 DIAGNOSIS — D151 Benign neoplasm of heart: Secondary | ICD-10-CM | POA: Insufficient documentation

## 2011-04-15 SURGERY — JV LEFT HEART CATHETERIZATION WITH CORONARY ANGIOGRAM
Anesthesia: Moderate Sedation

## 2011-04-15 MED ORDER — SODIUM CHLORIDE 0.9 % IV SOLN: INTRAVENOUS | Status: DC

## 2011-04-15 MED ORDER — HYDRALAZINE HCL 20 MG/ML IJ SOLN
10.0000 mg | Freq: Once | INTRAMUSCULAR | Status: AC
  Administered 2011-04-15: 10 mg via INTRAVENOUS

## 2011-04-15 MED ORDER — ACETAMINOPHEN 325 MG PO TABS: 650.0000 mg | ORAL_TABLET | ORAL | Status: DC | PRN

## 2011-04-15 MED ORDER — PREDNISONE 1 MG PO TABS: 60.0000 mg | ORAL_TABLET | ORAL | Status: DC

## 2011-04-15 MED ORDER — SODIUM CHLORIDE 0.45 % IV SOLN: INTRAVENOUS | Status: DC

## 2011-04-15 MED ORDER — SODIUM CHLORIDE 0.9 % IV SOLN: 4.0000 mg | Freq: Four times a day (QID) | INTRAVENOUS | Status: DC | PRN

## 2011-04-15 MED ORDER — DIAZEPAM 2 MG PO TABS: 5.0000 mg | ORAL_TABLET | ORAL | Status: DC

## 2011-04-15 NOTE — Op Note (Signed)
Brief cardiac cath note.   Please see dictated cath report.   Procedure:  1. LHC 2. Coronary angiography 3. Descending aortography  Indication: Preoperative procedure for left atrial myxoma resection.   No complications.  Findings: 1. LV-gram was not done (large left atrial tumor).  2. Hemodynamics: LV 172/20.  Aorta 172/81.  3. RCA: Dominant vessel with mild luminal irregularities.   4. LM: No angiographic CAD.  5. Left CFX system: Several small obuse marginals with no angiographic disease.  6. LAD system: There was calcification in the proximal LAD with about 20% stenosis.  There was a moderate high 1st diagonal with 40-50% ostial stenosis.   7. Descending aortogram with runoff: Normal caliber descending aorta.  No significant angiographic disease involving the right iliac system.   Impression:  Mild CAD (40-50% ostial D1).  Aggressive risk factor reduction, proceed to surgery.

## 2011-04-15 NOTE — Procedures (Signed)
Prior office note reviewed. No change to history of physical exam.  Marca Ancona

## 2011-04-15 NOTE — H&P (Signed)
PCP is No primary provider on file.Renford Dills,  MD Referring Provider is Cassell Clement, MD    Chief Complaint   Patient presents with   .  Atrial Mass     HPI: Patient is a 70 year old African American female with history of hypertension, type 2 diabetes mellitus, and hyperlipidemia. She has had multiple previous surgical procedures in the past for a variety of orthopedic problems. Because of her associated risk factors, she was evaluated many years ago for the possible presence of coronary artery disease and she underwent cardiac catheterization demonstrating mild nonobstructive coronary artery disease in 2002. She also has history of a heart murmur which was originally picked up at age 31. She has been followed for many years by Dr. Patty Sermons. However, she has not seen Dr. Patty Sermons since 2008. Over the last year and a half or so the patient has developed progressive sensation of generalized fatigue as well as worsening exertional shortness of breath. She has atypical chest pain and occasional palpitations. She was recently seen in followup by Dr. Patty Sermons, and a stress test was performed that was felt to be low risk for ischemic heart disease. She also had a 2-D echocardiogram performed 03/26/2011. 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 No other significant abnormalities were noted. The patient was referred for elective surgical intervention and seen in consultation last week.  She underwent cardiac catheterization 04/15/2011 revealing mild coronary artery disease with no significant flow-limiting lesions.  She now presents for elective surgery.     Past Medical History   Diagnosis  Date     .  Thyroid disease     .  Hyperlipidemia     .  Hypertension     .  Diabetes mellitus     .  Vertigo     .  Coronary artery disease         minimal nonobstructive   .  Osteoarthritis     .  Heart palpitations         hx of   .  Heart murmur         age 73   .  Back pain         Past Surgical History   Procedure  Date   .  Cardiac catheterization  2002       minimal nonobstructive CAD   .  Total knee arthroplasty         right and left   .  Other surgical history         hysterectomy,fibroid tumors       Family History   Problem  Relation  Age of Onset   .  Heart attack  Mother         age 73   .  Stroke  Mother     .  Heart attack  Father       Social History History   Substance Use Topics   .  Smoking status:  Former Smoker       Quit date:  06/10/1990   .  Smokeless tobacco:  Never Used   .  Alcohol Use:  Not on file       Current Outpatient Prescriptions   Medication  Sig  Dispense  Refill   .  amLODipine (NORVASC) 10 MG tablet  Take 10 mg by mouth daily.           Marland Kitchen  aspirin 81 MG tablet  Take 81 mg by mouth 2 (two) times daily.           .  calcium-vitamin D (OSCAL WITH D) 500-200 MG-UNIT per tablet  Take 2 tablets by mouth daily. Chewable          .  Coenzyme Q10 (CO Q 10 PO)  Take by mouth. Taking co q 30 daily          .  EVENING PRIMROSE OIL PO  Take 500 mg by mouth daily.           .  Ginkgo Biloba (GINKOBA PO)  Take by mouth. Takes with [place]. Johns wort          .  levothyroxine (SYNTHROID, LEVOTHROID) 137 MCG tablet  Take 137 mcg by mouth daily. Taking 0.1mg  daily         .  lisinopril (PRINIVIL,ZESTRIL) 20 MG tablet  Take 20 mg by mouth 2 (two) times daily.          .  meclizine (ANTIVERT) 25 MG tablet  Take 25 mg by mouth 3 (three) times daily as needed.           .  metFORMIN (GLUCOPHAGE) 500 MG tablet  Take 500 mg by mouth daily. Taking 1500 daily         .  methocarbamol (ROBAXIN) 750 MG tablet  Take 750 mg by mouth as needed.           .   metoprolol (TOPROL-XL) 100 MG 24 hr tablet  Take 100 mg by mouth daily.          .  Multiple Vitamin (MULTIVITAMIN PO)  Take by mouth.           .  Omega-3 Fatty Acids (FISH OIL) 1200 MG CAPS  Take by mouth 2 (two) times daily. 1500 daily         .  potassium chloride SA (K-DUR,KLOR-CON) 20 MEQ tablet  Take 20 mEq by mouth 2 (two) times daily.           .  rosuvastatin (CRESTOR) 20 MG tablet  Take 20 mg by mouth daily.           .  solifenacin (VESICARE) 5 MG tablet  Take 10 mg by mouth daily. As needed          .  spironolactone (ALDACTONE) 25 MG tablet  Take 25 mg by mouth daily.           .  ST JOHNS WORT PO  Take 300 mg by mouth 2 (two) times daily.           Marland Kitchen  zolpidem (AMBIEN) 10 MG tablet  Take 10 mg by mouth at bedtime as needed. 1/2 to one daily          .  predniSONE (DELTASONE) 20 MG tablet  Take 3 tablets Monday night (total of 60 mg) and 3 tablets Tuesday morning about 10:30 prior to procedure   6 tablet   0       Allergies   Allergen  Reactions   .  Ivp Dye (Iodinated Diagnostic Agents)  Anaphylaxis       Throat and face swelling     Review of Systems: The patient complains of chronic fatigue and a sensation  of feeling tired. She says this seems to have gradually progressed over the last year and a half or so. She is 5 foot 6 inches tall and weighs 242 pounds. She reports that this is more or less stable. The patient has symptoms of exertional shortness of breath. She denies resting shortness of breath, PND, orthopnea, syncope. She has occasional dizzy spells an occasional palpitations. She has occasional fleeting chest pains that are atypical and not necessarily related to physical activity. The patient denies productive cough, hemoptysis, wheezing. She has no difficulty swallowing. She has normal bowel function. She denies any history of hematochezia, hematemesis, melena.. The patient has no difficulty urinating. She does urinate frequently but this is particularly associated with  diuretic medication. She denies any recent urinary infections. The patient denies pain in her legs with ambulation suggestive of claudication. Patient does have chronic pain in her right knee related to degenerative changes with history of previous right total knee replacement approximately 16 years ago. She also has some chronic pain and weakness in her back. Because of this she walks using a walker and get start easily. The patient also has some history of depression recently. She has no ongoing dental issues and reports no loose teeth and painful teeth. She recently had right cataract extraction and she reports that she's needs to have a left cataract removed as well but this has been postponed. The remainder of her review of systems unrevealing.  BP 153/79  Pulse 64  Resp 16  Ht 5\' 6"  (1.676 m)  Wt 242 lb (109.77 kg)  BMI 39.06 kg/m2  SpO2 96% Physical Exam: Patient is a well-appearing obese African American female who appears his stated age in no acute distress. HEENT exam is unrevealing. The neck is supple. There are no carotid bruits. There is no palpable lymphadenopathy. Auscultation of the chest demonstrates clear breath sounds which are symmetrical bilaterally. No wheezes rales or rhonchi are noted. Neurovascular exam is notable for regular rate and rhythm. No murmurs rubs or gallops are appreciated. The abdomen is soft nontender. There no obvious masses. Bowel sounds are present. Extremities warm and well-perfused. There is no lower extremity edema. Distal pulses are not palpable in either lower leg at the ankle. Femoral pulses are diminished. Rectal and GU exams are both deferred. Neurologic examination is grossly nonfocal and symmetrical bilaterally. The remainder of her physical exam is noncontributory.  Diagnostic Tests: Transesophageal echocardiogram performed 04/02/2011 is reviewed. This demonstrates a large bilobed mass in the left atrium that emanates from a stalk attached to the  intra-atrial septum. Characteristics of the mass or classical for left atrial myxoma. There is trivial associated mitral regurgitation. Left ventricular size and function appear normal. No other abnormalities are noted.  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 Risks of surgery will be slightly elevated due to the patient's morbid obesity and other medical problems.  However, she has normal left ventricular function and no significant coronary artery disease.  Overall I expect that she should do well.  Options include resection of the mass via conventional median sternotomy versus the right mini thoracotomy approach.  Plan: I've discussed matters at length with the patient here in the office today. She came in alone for consultation today. She is single and lives alone.  She has one son who lives in [place].  She does not have any other family nearby, but she reports having numerous close friends.  She will certainly  need assistance during her recovery following surgery, particularly considering that her mobility is somewhat limited at baseline. We discussed the indications, risks, and potential benefits of resection of her left atrial mass. We discussed surgical alternatives and in particular we discussed the possibility of approaching her surgery to the right mini thoracotomy approach versus conventional sternotomy.  She seems inclined to prefer a conventional sternotomy because of concerns regarding the need for femoral cannulation using the minimally invasive approach.  We discussed all potential associated risks of surgery including but not limited to risk of death, stroke, myocardial infarction, congestive heart failure, respiratory failure, renal failure, bleeding requiring blood transfusion, arrhythmia, infection, late recurrence of atrial myxoma. We particularly discussed the fairly high associated risk of postoperative atrial arrhythmias  including atrial fibrillation and atrial flutter. Because of her history of IV contrast allergy, I will not plan to administer amiodarone preoperatively as we might otherwise.  We discussed expectations with surgery as well as her postoperative recovery and convalescence at length. All of her questions been addressed.

## 2011-04-16 ENCOUNTER — Other Ambulatory Visit (HOSPITAL_COMMUNITY): Payer: Self-pay | Admitting: Respiratory Therapy

## 2011-04-16 ENCOUNTER — Encounter (HOSPITAL_COMMUNITY): Payer: Self-pay

## 2011-04-16 ENCOUNTER — Encounter (HOSPITAL_COMMUNITY): Payer: Self-pay | Admitting: Pharmacy Technician

## 2011-04-16 ENCOUNTER — Other Ambulatory Visit (HOSPITAL_COMMUNITY): Payer: Self-pay | Admitting: Radiology

## 2011-04-16 ENCOUNTER — Other Ambulatory Visit: Payer: Self-pay

## 2011-04-16 ENCOUNTER — Encounter (HOSPITAL_COMMUNITY)
Admission: RE | Admit: 2011-04-16 | Discharge: 2011-04-16 | Disposition: A | Discharge status: Home / Self Care | Payer: Medicare Other | Source: Ambulatory Visit | Attending: Thoracic Surgery (Cardiothoracic Vascular Surgery) | Admitting: Thoracic Surgery (Cardiothoracic Vascular Surgery)

## 2011-04-16 ENCOUNTER — Ambulatory Visit (HOSPITAL_COMMUNITY)
Admission: RE | Admit: 2011-04-16 | Discharge: 2011-04-16 | Disposition: A | Discharge status: Home / Self Care | Payer: Medicare Other | Source: Ambulatory Visit | Attending: Surgery | Admitting: Surgery

## 2011-04-16 DIAGNOSIS — D492 Neoplasm of unspecified behavior of bone, soft tissue, and skin: Secondary | ICD-10-CM

## 2011-04-16 DIAGNOSIS — I2510 Atherosclerotic heart disease of native coronary artery without angina pectoris: Secondary | ICD-10-CM

## 2011-04-16 HISTORY — DX: Depression, unspecified: F32.A

## 2011-04-16 HISTORY — DX: Angina pectoris, unspecified: I20.9

## 2011-04-16 HISTORY — DX: Major depressive disorder, single episode, unspecified: F32.9

## 2011-04-16 HISTORY — DX: Anxiety disorder, unspecified: F41.9

## 2011-04-16 HISTORY — DX: Shortness of breath: R06.02

## 2011-04-16 HISTORY — DX: Sleep apnea, unspecified: G47.30

## 2011-04-16 LAB — COMPREHENSIVE METABOLIC PANEL
AST: 28 U/L (ref 0–37)
Albumin: 3.9 g/dL (ref 3.5–5.2)
Calcium: 10.3 mg/dL (ref 8.4–10.5)
Creatinine, Ser: 1.16 mg/dL — ABNORMAL HIGH (ref 0.50–1.10)
Total Protein: 7.5 g/dL (ref 6.0–8.3)

## 2011-04-16 LAB — HEMOGLOBIN A1C
Hgb A1c MFr Bld: 6.7 % — ABNORMAL HIGH (ref <5.7)
Mean Plasma Glucose: 146 mg/dL — ABNORMAL HIGH (ref <117)

## 2011-04-16 LAB — SURGICAL PCR SCREEN: MRSA, PCR: NEGATIVE

## 2011-04-16 LAB — PROTIME-INR: Prothrombin Time: 13.7 seconds (ref 11.6–15.2)

## 2011-04-16 LAB — CBC
MCH: 28.5 pg (ref 26.0–34.0)
MCHC: 34.5 g/dL (ref 30.0–36.0)
MCV: 82.8 fL (ref 78.0–100.0)
Platelets: 278 10*3/uL (ref 150–400)
RDW: 14.2 % (ref 11.5–15.5)
WBC: 12.4 10*3/uL — ABNORMAL HIGH (ref 4.0–10.5)

## 2011-04-16 LAB — URINALYSIS, ROUTINE W REFLEX MICROSCOPIC
Glucose, UA: NEGATIVE mg/dL
Hgb urine dipstick: NEGATIVE
Protein, ur: NEGATIVE mg/dL
pH: 6 (ref 5.0–8.0)

## 2011-04-16 LAB — APTT: aPTT: 28 seconds (ref 24–37)

## 2011-04-16 LAB — ABO/RH: ABO/RH(D): O POS

## 2011-04-16 LAB — BLOOD GAS, ARTERIAL
Acid-Base Excess: 4 mmol/L — ABNORMAL HIGH (ref 0.0–2.0)
Drawn by: 344381
O2 Saturation: 95.1 %
Patient temperature: 98.6
pCO2 arterial: 38.8 mmHg (ref 35.0–45.0)

## 2011-04-16 MED ORDER — PLASMA-LYTE 148 IV SOLN
INTRAVENOUS | Status: AC
  Administered 2011-04-17: 09:00:00
  Filled 2011-04-16: qty 0.5

## 2011-04-16 MED ORDER — DEXTROSE 5 % IV SOLN
750.0000 mg | INTRAVENOUS | Status: DC
  Filled 2011-04-16: qty 750

## 2011-04-16 MED ORDER — VANCOMYCIN HCL 1000 MG IV SOLR: 1500.0000 mg | Freq: Once | INTRAVENOUS | Status: DC

## 2011-04-16 MED ORDER — DOPAMINE-DEXTROSE 3.2-5 MG/ML-% IV SOLN
2.0000 ug/kg/min | INTRAVENOUS | Status: DC
  Filled 2011-04-16: qty 250

## 2011-04-16 MED ORDER — METOPROLOL TARTRATE 12.5 MG HALF TABLET: 12.5000 mg | ORAL_TABLET | Freq: Once | ORAL | Status: DC

## 2011-04-16 MED ORDER — CHLORHEXIDINE GLUCONATE 4 % EX LIQD: 30.0000 mL | CUTANEOUS | Status: DC

## 2011-04-16 MED ORDER — NITROGLYCERIN IN D5W 200-5 MCG/ML-% IV SOLN
2.0000 ug/min | INTRAVENOUS | Status: DC
  Filled 2011-04-16: qty 250

## 2011-04-16 MED ORDER — SODIUM CHLORIDE 0.9 % IV SOLN
INTRAVENOUS | Status: DC
  Filled 2011-04-16: qty 40

## 2011-04-16 MED ORDER — DEXTROSE 5 % IV SOLN
1.5000 g | Freq: Once | INTRAVENOUS | Status: DC
  Filled 2011-04-16: qty 1.5

## 2011-04-16 MED ORDER — SODIUM CHLORIDE 0.9 % IV SOLN
0.1000 ug/kg/h | INTRAVENOUS | Status: DC
  Filled 2011-04-16: qty 4

## 2011-04-16 MED ORDER — POTASSIUM CHLORIDE 2 MEQ/ML IV SOLN
80.0000 meq | INTRAVENOUS | Status: DC
  Filled 2011-04-16: qty 40

## 2011-04-16 MED ORDER — PHENYLEPHRINE HCL 10 MG/ML IJ SOLN
30.0000 ug/min | INTRAVENOUS | Status: DC
  Filled 2011-04-16: qty 2

## 2011-04-16 MED ORDER — VANCOMYCIN HCL 1000 MG IV SOLR
1500.0000 mg | Freq: Once | INTRAVENOUS | Status: DC
  Filled 2011-04-16: qty 1500

## 2011-04-16 MED ORDER — EPINEPHRINE HCL 1 MG/ML IJ SOLN
0.5000 ug/min | INTRAVENOUS | Status: DC
  Filled 2011-04-16: qty 4

## 2011-04-16 MED ORDER — MAGNESIUM SULFATE 50 % IJ SOLN
40.0000 meq | INTRAMUSCULAR | Status: DC
  Filled 2011-04-16: qty 10

## 2011-04-16 MED ORDER — SODIUM CHLORIDE 0.9 % IV SOLN
INTRAVENOUS | Status: DC
  Filled 2011-04-16: qty 1

## 2011-04-16 NOTE — Cardiovascular Report (Signed)
Erin Haynes, Erin Haynes NO.:  [id number]  MEDICAL RECORD NO.:  [id number]  LOCATION:                                 FACILITY:  PHYSICIAN:  Marca Ancona, MD      DATE OF BIRTH:  1940/11/28  DATE OF PROCEDURE:  04/15/2011 DATE OF DISCHARGE:                           CARDIAC CATHETERIZATION   PROCEDURE: 1. Left heart catheterization. 2. Coronary angiography. 3. Descending aortography.  INDICATION:  This 70 year old with preoperative evaluation today prior to left atrial myxoma resection.  She may have her procedure done via minimally invasive right mini-thoracotomy.  PROCEDURE NOTE:  After informed consent was obtained, the patient underwent Allen testing on her right wrist.  The Allen testing was negative.  She did not have good collateral circulation from the ulnar artery to the radial side of the hand, therefore we elected to use groin access.  The left femoral area was sterilely prepped and draped with 1% lidocaine, which was used to locally anesthetize the left femoral area. The left common femoral artery was entered using modified Seldinger technique and a 4-French arterial sheath was placed.  Right coronary artery was engaged with the 3-DRC catheter.  Left coronary artery was engaged best with the JL-5 catheter and aortography was done using the angled pigtail catheter.  There were no complications found.  FINDINGS: 1. Hemodynamics:  LV 172/20, aorta 132/81. 2. Left ventriculography was not done.  The patient had recent TEE and     echo and has a large left atrial tumor. 3. Right coronary artery:  The right coronary artery is dominant     vessel with mild luminal irregularities. 4. Left main:  Left main had no angiographic coronary artery disease. 5. Left circumflex system:  There were several small obtuse marginals     with no angiographic disease. 6. LAD system.  There was calcification of proximal LAD with about 20%     stenosis.  There was a  moderate to high first diagonal with 40-50%     ostial stenosis. 7. Descending aortogram with runoff.  There was normal caliber in the     descending aorta.  There was no significant angiographic disease     involving the right iliac system.  IMPRESSION:  The patient has mild coronary artery disease with a 40-50% ostial first diagonal.  PLAN:  We will plan on aggressive risk factor modification.  She should be okay to proceed for surgery.  There should be no reason not to do a surgery via a minimally invasive right mini-thoracotomy in this patient.     Marca Ancona, MD     DM/MEDQ  D:  04/15/2011  T:  04/15/2011  Job:  [id number]  cc:   Cassell Clement, M.D. Salvatore Decent. Cornelius Moras, M.D.

## 2011-04-16 NOTE — Progress Notes (Signed)
*  PRELIMINARY RESULTS* Carotid dopplers, upper extremities dopplers and lower arterial dopplers  Performed. Bilateral no ICA stenosis and antegrade vertebral artery flow. Lower extremity: Right ABI indicate mild reductions arterial flow. Left ABI indicate normal arterial flow. Palmer arch: Right obliterate with radial compression. Normal with ulnar compression. Left normal with radial and ulnar compression. Erin Haynes 04/16/2011, 5:27 PM

## 2011-04-16 NOTE — Progress Notes (Signed)
Requested most recent office note and stress test from Dr. Patty Sermons.

## 2011-04-16 NOTE — Pre-Procedure Instructions (Addendum)
[name]  04/16/2011   Your procedure is scheduled on:  04/17/2011  Report to [facility] at 5:30 AM.  Call this number if you have problems the morning of surgery: [phone]   Remember: Bring Incentive Spirometer back day of surgery-in suitcase.   Do not eat food:After Midnight.  Do not drink clear liquids: 4 Hours before arrival.  Take these medicines the morning of surgery with A SIP OF WATER: Norvasc/Amlopidine, Levothyroxine   Do not wear jewelry, make-up or nail polish.  Do not wear lotions, powders, or perfumes. You may wear deodorant.  Do not shave 48 hours prior to surgery.  Do not bring valuables to the hospital.  Contacts, dentures or bridgework may not be worn into surgery.  Leave suitcase in the car. After surgery it may be brought to your room.  For patients admitted to the hospital, checkout time is 11:00 AM the day of discharge.   Patients discharged the day of surgery will not be allowed to drive home.  Name and phone number of your driver: Julio Storr son [phone]  Special Instructions: CHG Shower Use Special Wash: 1/2 bottle night before surgery and 1/2 bottle morning of surgery.   Please read over the following fact sheets that you were given: Pain Booklet and Open Heart Packet

## 2011-04-16 NOTE — Progress Notes (Signed)
Also requested Sleep Study from [place] office where patient states study was done 2-3 yrs ago.

## 2011-04-17 ENCOUNTER — Other Ambulatory Visit: Payer: Self-pay | Admitting: Thoracic Surgery (Cardiothoracic Vascular Surgery)

## 2011-04-17 ENCOUNTER — Inpatient Hospital Stay (HOSPITAL_COMMUNITY): Payer: Medicare Other | Admitting: Anesthesiology

## 2011-04-17 ENCOUNTER — Encounter (HOSPITAL_COMMUNITY): Payer: Self-pay

## 2011-04-17 ENCOUNTER — Inpatient Hospital Stay (HOSPITAL_COMMUNITY)
Admission: RE | Admit: 2011-04-17 | Discharge: 2011-04-24 | DRG: 228 | Disposition: A | Discharge status: Skilled Nursing Facility | Payer: Medicare Other | Source: Ambulatory Visit | Attending: Thoracic Surgery (Cardiothoracic Vascular Surgery) | Admitting: Thoracic Surgery (Cardiothoracic Vascular Surgery)

## 2011-04-17 ENCOUNTER — Encounter (HOSPITAL_COMMUNITY)
Admission: RE | Disposition: A | Discharge status: Skilled Nursing Facility | Payer: Self-pay | Source: Ambulatory Visit | Attending: Thoracic Surgery (Cardiothoracic Vascular Surgery)

## 2011-04-17 ENCOUNTER — Inpatient Hospital Stay (HOSPITAL_COMMUNITY): Payer: Medicare Other

## 2011-04-17 ENCOUNTER — Encounter (HOSPITAL_COMMUNITY): Payer: Self-pay | Admitting: Anesthesiology

## 2011-04-17 ENCOUNTER — Encounter: Payer: Self-pay | Admitting: Thoracic Surgery (Cardiothoracic Vascular Surgery)

## 2011-04-17 DIAGNOSIS — I110 Hypertensive heart disease with heart failure: Secondary | ICD-10-CM | POA: Diagnosis present

## 2011-04-17 DIAGNOSIS — I4892 Unspecified atrial flutter: Secondary | ICD-10-CM | POA: Diagnosis not present

## 2011-04-17 DIAGNOSIS — I5033 Acute on chronic diastolic (congestive) heart failure: Secondary | ICD-10-CM | POA: Diagnosis not present

## 2011-04-17 DIAGNOSIS — J9819 Other pulmonary collapse: Secondary | ICD-10-CM | POA: Diagnosis not present

## 2011-04-17 DIAGNOSIS — Z96659 Presence of unspecified artificial knee joint: Secondary | ICD-10-CM

## 2011-04-17 DIAGNOSIS — D487 Neoplasm of uncertain behavior of other specified sites: Secondary | ICD-10-CM

## 2011-04-17 DIAGNOSIS — I4891 Unspecified atrial fibrillation: Secondary | ICD-10-CM | POA: Diagnosis not present

## 2011-04-17 DIAGNOSIS — D62 Acute posthemorrhagic anemia: Secondary | ICD-10-CM | POA: Diagnosis not present

## 2011-04-17 DIAGNOSIS — G4730 Sleep apnea, unspecified: Secondary | ICD-10-CM | POA: Diagnosis present

## 2011-04-17 DIAGNOSIS — E119 Type 2 diabetes mellitus without complications: Secondary | ICD-10-CM | POA: Diagnosis present

## 2011-04-17 DIAGNOSIS — I2510 Atherosclerotic heart disease of native coronary artery without angina pectoris: Secondary | ICD-10-CM | POA: Diagnosis present

## 2011-04-17 DIAGNOSIS — Z79899 Other long term (current) drug therapy: Secondary | ICD-10-CM

## 2011-04-17 DIAGNOSIS — I498 Other specified cardiac arrhythmias: Secondary | ICD-10-CM | POA: Diagnosis not present

## 2011-04-17 DIAGNOSIS — I509 Heart failure, unspecified: Secondary | ICD-10-CM | POA: Diagnosis not present

## 2011-04-17 DIAGNOSIS — E876 Hypokalemia: Secondary | ICD-10-CM | POA: Diagnosis not present

## 2011-04-17 DIAGNOSIS — E785 Hyperlipidemia, unspecified: Secondary | ICD-10-CM | POA: Diagnosis present

## 2011-04-17 DIAGNOSIS — D151 Benign neoplasm of heart: Principal | ICD-10-CM | POA: Diagnosis present

## 2011-04-17 HISTORY — PX: EXCISION MYXOMA: SHX5060

## 2011-04-17 LAB — POCT I-STAT, CHEM 8
Chloride: 103 mEq/L (ref 96–112)
Glucose, Bld: 145 mg/dL — ABNORMAL HIGH (ref 70–99)
HCT: 25 % — ABNORMAL LOW (ref 36.0–46.0)
Hemoglobin: 8.5 g/dL — ABNORMAL LOW (ref 12.0–15.0)
Potassium: 4 mEq/L (ref 3.5–5.1)

## 2011-04-17 LAB — POCT I-STAT 3, ART BLOOD GAS (G3+)
Acid-Base Excess: 2 mmol/L (ref 0.0–2.0)
Bicarbonate: 26 mEq/L — ABNORMAL HIGH (ref 20.0–24.0)
Bicarbonate: 26.7 mEq/L — ABNORMAL HIGH (ref 20.0–24.0)
O2 Saturation: 98 %
O2 Saturation: 98 %
O2 Saturation: 96 %
Patient temperature: 37.3
TCO2: 27 mmol/L (ref 0–100)
pCO2 arterial: 42.9 mmHg (ref 35.0–45.0)
pCO2 arterial: 44.5 mmHg (ref 35.0–45.0)
pH, Arterial: 7.39 (ref 7.350–7.400)
pO2, Arterial: 98 mmHg (ref 80.0–100.0)
pO2, Arterial: 116 mmHg — ABNORMAL HIGH (ref 80.0–100.0)
pO2, Arterial: 89 mmHg (ref 80.0–100.0)

## 2011-04-17 LAB — HEMOGLOBIN AND HEMATOCRIT, BLOOD
HCT: 24.1 % — ABNORMAL LOW (ref 36.0–46.0)
Hemoglobin: 8.3 g/dL — ABNORMAL LOW (ref 12.0–15.0)

## 2011-04-17 LAB — PLATELET COUNT: Platelets: 114 10*3/uL — ABNORMAL LOW (ref 150–400)

## 2011-04-17 LAB — CBC
HCT: 25.3 % — ABNORMAL LOW (ref 36.0–46.0)
HCT: 25.6 % — ABNORMAL LOW (ref 36.0–46.0)
Hemoglobin: 8.7 g/dL — ABNORMAL LOW (ref 12.0–15.0)
MCH: 28.1 pg (ref 26.0–34.0)
MCHC: 34 g/dL (ref 30.0–36.0)
MCV: 82.7 fL (ref 78.0–100.0)
Platelets: 138 10*3/uL — ABNORMAL LOW (ref 150–400)
RBC: 3.11 MIL/uL — ABNORMAL LOW (ref 3.87–5.11)
RDW: 14.1 % (ref 11.5–15.5)
WBC: 12.7 10*3/uL — ABNORMAL HIGH (ref 4.0–10.5)

## 2011-04-17 LAB — POCT I-STAT 4, (NA,K, GLUC, HGB,HCT)
Glucose, Bld: 152 mg/dL — ABNORMAL HIGH (ref 70–99)
HCT: 26 % — ABNORMAL LOW (ref 36.0–46.0)
Hemoglobin: 8.8 g/dL — ABNORMAL LOW (ref 12.0–15.0)
Potassium: 3.3 mEq/L — ABNORMAL LOW (ref 3.5–5.1)

## 2011-04-17 LAB — GLUCOSE, CAPILLARY
Glucose-Capillary: 114 mg/dL — ABNORMAL HIGH (ref 70–99)
Glucose-Capillary: 120 mg/dL — ABNORMAL HIGH (ref 70–99)
Glucose-Capillary: 127 mg/dL — ABNORMAL HIGH (ref 70–99)

## 2011-04-17 LAB — CREATININE, SERUM
Creatinine, Ser: 0.89 mg/dL (ref 0.50–1.10)
GFR calc Af Amer: 74 mL/min — ABNORMAL LOW (ref >90)
GFR calc non Af Amer: 64 mL/min — ABNORMAL LOW (ref >90)

## 2011-04-17 LAB — MAGNESIUM: Magnesium: 2.3 mg/dL (ref 1.5–2.5)

## 2011-04-17 SURGERY — EXCISION MYXOMA
Anesthesia: General | Site: Chest | Wound class: Clean

## 2011-04-17 MED ORDER — SODIUM CHLORIDE 0.9 % IV SOLN
INTRAVENOUS | Status: DC
  Administered 2011-04-18: 02:00:00 via INTRAVENOUS
  Filled 2011-04-17 (×2): qty 1

## 2011-04-17 MED ORDER — SODIUM CHLORIDE 0.45 % IV SOLN
INTRAVENOUS | Status: DC
  Administered 2011-04-17: 20 mL via INTRAVENOUS

## 2011-04-17 MED ORDER — MAGNESIUM SULFATE 50 % IJ SOLN
4.0000 g | Freq: Once | INTRAVENOUS | Status: AC
  Administered 2011-04-17: 4 g via INTRAVENOUS
  Filled 2011-04-17: qty 8

## 2011-04-17 MED ORDER — ACETAMINOPHEN 500 MG PO TABS: 1000.0000 mg | ORAL_TABLET | Freq: Four times a day (QID) | ORAL | Status: DC

## 2011-04-17 MED ORDER — SODIUM CHLORIDE 0.9 % IJ SOLN
10.0000 mL | INTRAMUSCULAR | Status: DC | PRN
  Administered 2011-04-20: 10 mL via INTRAVENOUS

## 2011-04-17 MED ORDER — HEPARIN SODIUM (PORCINE) 1000 UNIT/ML IJ SOLN
INTRAMUSCULAR | Status: DC | PRN
  Administered 2011-04-17: 30000 [IU] via INTRAVENOUS

## 2011-04-17 MED ORDER — PHENYLEPHRINE HCL 10 MG/ML IJ SOLN
0.0000 ug/min | INTRAMUSCULAR | Status: DC
  Filled 2011-04-17: qty 2

## 2011-04-17 MED ORDER — DEXTROSE 5 % IV SOLN
1.5000 g | INTRAVENOUS | Status: DC | PRN
  Administered 2011-04-17: 1.5 g via INTRAVENOUS

## 2011-04-17 MED ORDER — PROTAMINE SULFATE 10 MG/ML IV SOLN
INTRAVENOUS | Status: DC | PRN
  Administered 2011-04-17: 50 mg via INTRAVENOUS
  Administered 2011-04-17: 100 mg via INTRAVENOUS
  Administered 2011-04-17: 40 mg via INTRAVENOUS
  Administered 2011-04-17: 10 mg via INTRAVENOUS

## 2011-04-17 MED ORDER — CALCIUM CHLORIDE 10 % IV SOLN
INTRAVENOUS | Status: DC | PRN
  Administered 2011-04-17 (×2): .3 g via INTRAVENOUS

## 2011-04-17 MED ORDER — MORPHINE SULFATE 4 MG/ML IJ SOLN
2.0000 mg | INTRAMUSCULAR | Status: DC | PRN
  Administered 2011-04-17 (×2): 2 mg via INTRAVENOUS
  Administered 2011-04-18: 4 mg via INTRAVENOUS
  Filled 2011-04-17 (×2): qty 1

## 2011-04-17 MED ORDER — ACETAMINOPHEN 160 MG/5ML PO SOLN
975.0000 mg | Freq: Four times a day (QID) | ORAL | Status: AC
  Filled 2011-04-17: qty 40.6

## 2011-04-17 MED ORDER — ONDANSETRON HCL 4 MG/2ML IJ SOLN: 4.0000 mg | Freq: Four times a day (QID) | INTRAMUSCULAR | Status: DC | PRN

## 2011-04-17 MED ORDER — DEXTROSE 5 % IV SOLN
1.5000 g | INTRAVENOUS | Status: DC
  Filled 2011-04-17: qty 1.5

## 2011-04-17 MED ORDER — FAMOTIDINE IN NACL 20-0.9 MG/50ML-% IV SOLN: 20.0000 mg | Freq: Two times a day (BID) | INTRAVENOUS | Status: DC

## 2011-04-17 MED ORDER — DEXTROSE 5 % IV SOLN: 1.5000 g | Freq: Two times a day (BID) | INTRAVENOUS | Status: DC

## 2011-04-17 MED ORDER — DEXTROSE 5 % IV SOLN
750.0000 g | INTRAVENOUS | Status: DC | PRN
  Administered 2011-04-17: .75 g via INTRAVENOUS

## 2011-04-17 MED ORDER — VANCOMYCIN HCL 1000 MG IV SOLR
1.5000 mg | INTRAVENOUS | Status: DC | PRN
  Administered 2011-04-17: 1.5 g via INTRAVENOUS

## 2011-04-17 MED ORDER — SODIUM CHLORIDE 0.9 % IJ SOLN: 3.0000 mL | INTRAMUSCULAR | Status: DC | PRN

## 2011-04-17 MED ORDER — BISACODYL 5 MG PO TBEC
10.0000 mg | DELAYED_RELEASE_TABLET | Freq: Every day | ORAL | Status: DC
  Administered 2011-04-18 – 2011-04-22 (×5): 10 mg via ORAL
  Filled 2011-04-17 (×5): qty 2

## 2011-04-17 MED ORDER — POTASSIUM CHLORIDE 10 MEQ/50ML IV SOLN: 10.0000 meq | INTRAVENOUS | Status: DC

## 2011-04-17 MED ORDER — LACTATED RINGERS IV SOLN: 500.0000 mL | Freq: Once | INTRAVENOUS | Status: AC | PRN

## 2011-04-17 MED ORDER — LACTATED RINGERS IV SOLN
INTRAVENOUS | Status: DC
  Administered 2011-04-17 (×2): 20 mL via INTRAVENOUS
  Administered 2011-04-17: 20 mL/h via INTRAVENOUS
  Administered 2011-04-18: 22:00:00 via INTRAVENOUS

## 2011-04-17 MED ORDER — SUFENTANIL CITRATE 50 MCG/ML IV SOLN
INTRAVENOUS | Status: DC | PRN
  Administered 2011-04-17: 5 ug via INTRAVENOUS
  Administered 2011-04-17: 40 ug via INTRAVENOUS
  Administered 2011-04-17: 5 ug via INTRAVENOUS
  Administered 2011-04-17 (×2): 50 ug via INTRAVENOUS

## 2011-04-17 MED ORDER — MIDAZOLAM HCL 2 MG/2ML IJ SOLN: 1.0000 mg | INTRAMUSCULAR | Status: DC | PRN

## 2011-04-17 MED ORDER — METOPROLOL TARTRATE 25 MG/10 ML ORAL SUSPENSION
12.5000 mg | Freq: Two times a day (BID) | ORAL | Status: DC
  Administered 2011-04-17: 12.5 mg
  Filled 2011-04-17 (×3): qty 5

## 2011-04-17 MED ORDER — METOCLOPRAMIDE HCL 10 MG PO TABS
10.0000 mg | ORAL_TABLET | Freq: Four times a day (QID) | ORAL | Status: AC
  Administered 2011-04-18 (×4): 10 mg via ORAL
  Filled 2011-04-17 (×4): qty 1

## 2011-04-17 MED ORDER — LACTATED RINGERS IV SOLN
INTRAVENOUS | Status: DC | PRN
  Administered 2011-04-17 (×2): via INTRAVENOUS

## 2011-04-17 MED ORDER — LACTATED RINGERS IV SOLN: INTRAVENOUS | Status: AC

## 2011-04-17 MED ORDER — SODIUM CHLORIDE 0.9 % IV SOLN: 250.0000 mL | INTRAVENOUS | Status: DC

## 2011-04-17 MED ORDER — SODIUM CHLORIDE 0.9 % IJ SOLN
OROMUCOSAL | Status: DC | PRN
  Administered 2011-04-17: 09:00:00 via TOPICAL

## 2011-04-17 MED ORDER — LACTATED RINGERS IV SOLN
INTRAVENOUS | Status: DC | PRN
  Administered 2011-04-17 (×2): via INTRAVENOUS

## 2011-04-17 MED ORDER — LACTATED RINGERS IV SOLN
INTRAVENOUS | Status: DC | PRN
  Administered 2011-04-17: 07:00:00 via INTRAVENOUS

## 2011-04-17 MED ORDER — SUCCINYLCHOLINE CHLORIDE 20 MG/ML IJ SOLN
INTRAMUSCULAR | Status: DC | PRN
  Administered 2011-04-17: 120 mg via INTRAVENOUS

## 2011-04-17 MED ORDER — METOPROLOL TARTRATE 12.5 MG HALF TABLET
12.5000 mg | ORAL_TABLET | Freq: Two times a day (BID) | ORAL | Status: DC
  Filled 2011-04-17 (×3): qty 1

## 2011-04-17 MED ORDER — DIAZEPAM 5 MG/ML IJ SOLN
5.0000 mg | Freq: Once | INTRAMUSCULAR | Status: AC
  Administered 2011-04-17: 5 mg via INTRAVENOUS

## 2011-04-17 MED ORDER — PANTOPRAZOLE SODIUM 40 MG PO TBEC
40.0000 mg | DELAYED_RELEASE_TABLET | Freq: Every day | ORAL | Status: DC
  Administered 2011-04-19 – 2011-04-24 (×6): 40 mg via ORAL
  Filled 2011-04-17 (×4): qty 1

## 2011-04-17 MED ORDER — NITROGLYCERIN IN D5W 200-5 MCG/ML-% IV SOLN
0.0000 ug/min | INTRAVENOUS | Status: DC
  Filled 2011-04-17: qty 250

## 2011-04-17 MED ORDER — VANCOMYCIN HCL 1000 MG IV SOLR
1500.0000 mg | INTRAVENOUS | Status: DC
  Filled 2011-04-17: qty 1500

## 2011-04-17 MED ORDER — NITROGLYCERIN IN D5W 200-5 MCG/ML-% IV SOLN
INTRAVENOUS | Status: DC | PRN
  Administered 2011-04-17: 5 ug/min via INTRAVENOUS

## 2011-04-17 MED ORDER — METOPROLOL TARTRATE 1 MG/ML IV SOLN
2.5000 mg | INTRAVENOUS | Status: DC | PRN
  Administered 2011-04-18 – 2011-04-20 (×3): 5 mg via INTRAVENOUS
  Filled 2011-04-17 (×2): qty 5

## 2011-04-17 MED ORDER — SODIUM CHLORIDE 0.9 % IV SOLN
0.1000 ug/kg/h | INTRAVENOUS | Status: DC
  Filled 2011-04-17: qty 2

## 2011-04-17 MED ORDER — ASPIRIN 81 MG PO CHEW: 324.0000 mg | CHEWABLE_TABLET | Freq: Every day | ORAL | Status: DC

## 2011-04-17 MED ORDER — ALBUMIN HUMAN 5 % IV SOLN
INTRAVENOUS | Status: DC | PRN
  Administered 2011-04-17 (×2): via INTRAVENOUS

## 2011-04-17 MED ORDER — BISACODYL 10 MG RE SUPP: 10.0000 mg | Freq: Every day | RECTAL | Status: DC

## 2011-04-17 MED ORDER — ASPIRIN EC 325 MG PO TBEC
325.0000 mg | DELAYED_RELEASE_TABLET | Freq: Every day | ORAL | Status: DC
  Administered 2011-04-18 – 2011-04-24 (×7): 325 mg via ORAL
  Filled 2011-04-17 (×7): qty 1

## 2011-04-17 MED ORDER — PROPOFOL 10 MG/ML IV EMUL
INTRAVENOUS | Status: DC | PRN
  Administered 2011-04-17: 200 mg via INTRAVENOUS

## 2011-04-17 MED ORDER — PHENYLEPHRINE HCL 10 MG/ML IJ SOLN
10000.0000 ug | INTRAVENOUS | Status: DC | PRN
  Administered 2011-04-17: 50 ug/min via INTRAVENOUS

## 2011-04-17 MED ORDER — SODIUM CHLORIDE 0.9 % IV SOLN
200.0000 ug | INTRAVENOUS | Status: DC | PRN
  Administered 2011-04-17: 0.3 ug/kg/h via INTRAVENOUS

## 2011-04-17 MED ORDER — INSULIN ASPART 100 UNIT/ML ~~LOC~~ SOLN
0.0000 [IU] | SUBCUTANEOUS | Status: AC
  Filled 2011-04-17: qty 3

## 2011-04-17 MED ORDER — SODIUM CHLORIDE 0.9 % IJ SOLN
3.0000 mL | Freq: Two times a day (BID) | INTRAMUSCULAR | Status: DC
  Administered 2011-04-18 – 2011-04-20 (×4): 3 mL via INTRAVENOUS

## 2011-04-17 MED ORDER — SODIUM CHLORIDE 0.9 % IV SOLN: INTRAVENOUS | Status: DC

## 2011-04-17 MED ORDER — MIDAZOLAM HCL 5 MG/5ML IJ SOLN
INTRAMUSCULAR | Status: DC | PRN
  Administered 2011-04-17: 1 mg via INTRAVENOUS
  Administered 2011-04-17: 2 mg via INTRAVENOUS
  Administered 2011-04-17: 7 mg via INTRAVENOUS

## 2011-04-17 MED ORDER — ACETAMINOPHEN 650 MG RE SUPP
650.0000 mg | RECTAL | Status: AC
  Administered 2011-04-17: 650 mg via RECTAL

## 2011-04-17 MED ORDER — ACETAMINOPHEN 500 MG PO TABS
1000.0000 mg | ORAL_TABLET | Freq: Four times a day (QID) | ORAL | Status: AC
  Administered 2011-04-18 – 2011-04-22 (×19): 1000 mg via ORAL
  Filled 2011-04-17 (×18): qty 2

## 2011-04-17 MED ORDER — MORPHINE SULFATE 2 MG/ML IJ SOLN: 1.0000 mg | INTRAMUSCULAR | Status: AC | PRN

## 2011-04-17 MED ORDER — ACETAMINOPHEN 160 MG/5ML PO SOLN: 975.0000 mg | Freq: Four times a day (QID) | ORAL | Status: DC

## 2011-04-17 MED ORDER — OXYCODONE HCL 5 MG PO TABS
5.0000 mg | ORAL_TABLET | ORAL | Status: DC | PRN
  Administered 2011-04-18: 5 mg via ORAL
  Administered 2011-04-18: 10 mg via ORAL
  Administered 2011-04-19 – 2011-04-20 (×3): 5 mg via ORAL
  Filled 2011-04-17: qty 2
  Filled 2011-04-17 (×5): qty 1

## 2011-04-17 MED ORDER — MIDAZOLAM HCL 2 MG/2ML IJ SOLN: 2.0000 mg | INTRAMUSCULAR | Status: DC | PRN

## 2011-04-17 MED ORDER — SODIUM CHLORIDE 0.9 % IR SOLN
Status: DC | PRN
  Administered 2011-04-17: 3000 mL

## 2011-04-17 MED ORDER — GLUTARALDEHYDE 0.625% SOAKING SOLUTION
TOPICAL | Status: DC | PRN
  Filled 2011-04-17: qty 50

## 2011-04-17 MED ORDER — SODIUM CHLORIDE 0.9 % IV SOLN
100.0000 [IU] | INTRAVENOUS | Status: DC | PRN
  Administered 2011-04-17: 3.8 [IU]/h via INTRAVENOUS

## 2011-04-17 MED ORDER — ALBUMIN HUMAN 5 % IV SOLN: 250.0000 mL | INTRAVENOUS | Status: DC | PRN

## 2011-04-17 MED ORDER — INSULIN ASPART 100 UNIT/ML ~~LOC~~ SOLN
0.0000 [IU] | SUBCUTANEOUS | Status: DC
  Filled 2011-04-17: qty 3

## 2011-04-17 MED ORDER — FENTANYL CITRATE 0.05 MG/ML IJ SOLN: 50.0000 ug | INTRAMUSCULAR | Status: DC | PRN

## 2011-04-17 MED ORDER — DOCUSATE SODIUM 100 MG PO CAPS
200.0000 mg | ORAL_CAPSULE | Freq: Every day | ORAL | Status: DC
  Administered 2011-04-18 – 2011-04-22 (×5): 200 mg via ORAL
  Filled 2011-04-17 (×6): qty 2

## 2011-04-17 MED ORDER — VANCOMYCIN HCL 1000 MG IV SOLR: 1000.0000 mg | Freq: Once | INTRAVENOUS | Status: DC

## 2011-04-17 MED ORDER — POTASSIUM CHLORIDE 10 MEQ/50ML IV SOLN
10.0000 meq | INTRAVENOUS | Status: AC
  Administered 2011-04-17 (×2): 10 meq via INTRAVENOUS

## 2011-04-17 MED ORDER — DEXTROSE 5 % IV SOLN
1.5000 g | Freq: Two times a day (BID) | INTRAVENOUS | Status: AC
  Administered 2011-04-17 – 2011-04-18 (×3): 1.5 g via INTRAVENOUS
  Filled 2011-04-17 (×4): qty 1.5

## 2011-04-17 MED ORDER — VANCOMYCIN HCL 1000 MG IV SOLR
1000.0000 mg | Freq: Once | INTRAVENOUS | Status: AC
  Administered 2011-04-17: 1000 mg via INTRAVENOUS
  Filled 2011-04-17: qty 1000

## 2011-04-17 MED ORDER — ACETAMINOPHEN 160 MG/5ML PO SOLN: 650.0000 mg | ORAL | Status: AC

## 2011-04-17 MED ORDER — ROCURONIUM BROMIDE 100 MG/10ML IV SOLN
INTRAVENOUS | Status: DC | PRN
  Administered 2011-04-17: 30 mg via INTRAVENOUS
  Administered 2011-04-17: 50 mg via INTRAVENOUS
  Administered 2011-04-17 (×2): 20 mg via INTRAVENOUS
  Administered 2011-04-17: 50 mg via INTRAVENOUS

## 2011-04-17 SURGICAL SUPPLY — 129 items
ADAPTER CARDIO PERF ANTE/RETRO (ADAPTER) ×2 IMPLANT
ATTRACTOMAT 16X20 MAGNETIC DRP (DRAPES) ×2 IMPLANT
BAG DECANTER FOR FLEXI CONT (MISCELLANEOUS) ×2 IMPLANT
BENZOIN TINCTURE PRP APPL 2/3 (GAUZE/BANDAGES/DRESSINGS) IMPLANT
BLADE STERNUM SYSTEM 6 (BLADE) ×2 IMPLANT
BLADE SURG 11 STRL SS (BLADE) ×2 IMPLANT
CANISTER SUCTION 2500CC (MISCELLANEOUS) ×2 IMPLANT
CANNULA ANTEGRADE CPL6 STR 12 (CANNULA) ×2 IMPLANT
CANNULA FEM VENOUS REMOTE 22FR (CANNULA) ×2 IMPLANT
CANNULA FEMORAL ART 14 SM (MISCELLANEOUS) IMPLANT
CANNULA GUNDRY RCSP 15FR (MISCELLANEOUS) ×2 IMPLANT
CANNULA OPTISITE PERFUSION 16F (CANNULA) IMPLANT
CANNULA OPTISITE PERFUSION 18F (CANNULA) IMPLANT
CANNULA SOFTFLOW AORTIC 8M24FR (CANNULA) ×2 IMPLANT
CANNULA VENNOUS METAL TIP 20FR (CANNULA) ×2 IMPLANT
CANNULA VRC MALB SNGL STG 28FR (MISCELLANEOUS) ×1 IMPLANT
CARDIAC SUCTION (MISCELLANEOUS) IMPLANT
CLOTH BEACON ORANGE TIMEOUT ST (SAFETY) ×2 IMPLANT
CONN 3/8X1/2 ST GISH (MISCELLANEOUS) ×2 IMPLANT
CONN 3/8X3/8 GISH STERILE (MISCELLANEOUS) ×2 IMPLANT
CONN ST 1/4X3/8  BEN (MISCELLANEOUS) ×2
CONN ST 1/4X3/8 BEN (MISCELLANEOUS) ×2 IMPLANT
CONT SPEC STER OR (MISCELLANEOUS) ×2 IMPLANT
COVER MAYO STAND STRL (DRAPES) ×2 IMPLANT
COVER SURGICAL LIGHT HANDLE (MISCELLANEOUS) ×4 IMPLANT
CRADLE DONUT ADULT HEAD (MISCELLANEOUS) ×2 IMPLANT
DERMABOND ADVANCED (GAUZE/BANDAGES/DRESSINGS)
DERMABOND ADVANCED .7 DNX12 (GAUZE/BANDAGES/DRESSINGS) IMPLANT
DEVICE TROCAR PUNCTURE CLOSURE (ENDOMECHANICALS) IMPLANT
DRAIN CHANNEL 28F RND 3/8 FF (WOUND CARE) IMPLANT
DRAIN CHANNEL 32F RND 10.7 FF (WOUND CARE) ×4 IMPLANT
DRAPE BILATERAL SPLIT (DRAPES) IMPLANT
DRAPE C-ARM 42X72 X-RAY (DRAPES) IMPLANT
DRAPE CARDIOVASCULAR INCISE (DRAPES) ×1
DRAPE CV SPLIT W-CLR ANES SCRN (DRAPES) IMPLANT
DRAPE INCISE IOBAN 66X45 STRL (DRAPES) IMPLANT
DRAPE SLUSH/WARMER DISC (DRAPES) ×2 IMPLANT
DRAPE SRG 135X102X78XABS (DRAPES) ×1 IMPLANT
DRSG COVADERM 4X14 (GAUZE/BANDAGES/DRESSINGS) ×2 IMPLANT
DRSG COVADERM 4X8 (GAUZE/BANDAGES/DRESSINGS) IMPLANT
ELECT BLADE 6.5 EXT (BLADE) IMPLANT
ELECT REM PT RETURN 9FT ADLT (ELECTROSURGICAL) ×4
ELECTRODE REM PT RTRN 9FT ADLT (ELECTROSURGICAL) ×2 IMPLANT
FEMORAL VENOUS CANN RAP (CANNULA) IMPLANT
GAUZE SPONGE 4X4 12PLY STRL LF (GAUZE/BANDAGES/DRESSINGS) ×2 IMPLANT
GLOVE ORTHO TXT STRL SZ7.5 (GLOVE) ×6 IMPLANT
GOWN STRL NON-REIN LRG LVL3 (GOWN DISPOSABLE) ×8 IMPLANT
GRAFT PTCH CORMATRIX 7X10 4PLY (Prosthesis & Implant Heart) ×2 IMPLANT
GUIDEWIRE ANG ZIPWIRE 038X150 (WIRE) IMPLANT
INSERT CONFORM CROSS CLAMP 66M (MISCELLANEOUS) IMPLANT
INSERT CONFORM CROSS CLAMP 86M (MISCELLANEOUS) IMPLANT
INSERT FOGARTY XLG (MISCELLANEOUS) ×2 IMPLANT
KIT BASIN OR (CUSTOM PROCEDURE TRAY) ×2 IMPLANT
KIT DILATOR VASC 18G NDL (KITS) ×8 IMPLANT
KIT DRAINAGE VACCUM ASSIST (KITS) ×2 IMPLANT
KIT ROOM TURNOVER OR (KITS) ×2 IMPLANT
KIT SUCTION CATH 14FR (SUCTIONS) ×2 IMPLANT
LEAD PACING MYOCARDI (MISCELLANEOUS) ×2 IMPLANT
LINE VENT (MISCELLANEOUS) ×2 IMPLANT
NEEDLE AORTIC ROOT 14G 7F (CATHETERS) ×2 IMPLANT
NS IRRIG 1000ML POUR BTL (IV SOLUTION) ×10 IMPLANT
PACK OPEN HEART (CUSTOM PROCEDURE TRAY) ×2 IMPLANT
PAD ARMBOARD 7.5X6 YLW CONV (MISCELLANEOUS) ×2 IMPLANT
PAD ELECT DEFIB RADIOL ZOLL (MISCELLANEOUS) IMPLANT
PATCH CORMATRIC 7 X 10 IMPLANT
RETRACTOR TRL SOFT TISSUE LG (INSTRUMENTS) IMPLANT
RETRACTOR TRM SOFT TISSUE 7.5 (INSTRUMENTS) IMPLANT
RUBBERBAND STERILE (MISCELLANEOUS) IMPLANT
SET CARDIOPLEGIA MPS 5001102 (MISCELLANEOUS) ×2 IMPLANT
SET IRRIG TUBING LAPAROSCOPIC (IRRIGATION / IRRIGATOR) IMPLANT
SOLUTION ANTI FOG 6CC (MISCELLANEOUS) ×2 IMPLANT
SPONGE GAUZE 4X4 12PLY (GAUZE/BANDAGES/DRESSINGS) ×2 IMPLANT
SUT BONE WAX W31G (SUTURE) ×2 IMPLANT
SUT ETHIBOND (SUTURE) IMPLANT
SUT ETHIBOND 2 0 SH (SUTURE) ×4 IMPLANT
SUT ETHIBOND 2 0 V4 (SUTURE) IMPLANT
SUT ETHIBOND 2 0V4 GREEN (SUTURE) IMPLANT
SUT ETHIBOND 2-0 RB-1 WHT (SUTURE) IMPLANT
SUT ETHIBOND 4 0 TF (SUTURE) IMPLANT
SUT ETHIBOND 5 0 C 1 30 (SUTURE) IMPLANT
SUT ETHIBOND NAB MH 2-0 36IN (SUTURE) IMPLANT
SUT ETHIBOND X763 2 0 SH 1 (SUTURE) ×2 IMPLANT
SUT GORETEX 6.0 TH-9 30 IN (SUTURE) IMPLANT
SUT GORETEX CV 4 TH 22 36 (SUTURE) ×4 IMPLANT
SUT GORETEX CV-5THC-13 36IN (SUTURE) IMPLANT
SUT GORETEX CV4 TH-18 (SUTURE) ×4 IMPLANT
SUT GORETEX TH-18 36 INCH (SUTURE) IMPLANT
SUT MNCRL AB 3-0 PS2 18 (SUTURE) ×4 IMPLANT
SUT PROLENE 3 0 SH DA (SUTURE) ×8 IMPLANT
SUT PROLENE 3 0 SH1 36 (SUTURE) ×12 IMPLANT
SUT PROLENE 4 0 RB 1 (SUTURE) ×4
SUT PROLENE 4 0 SH DA (SUTURE) ×2 IMPLANT
SUT PROLENE 4-0 RB1 .5 CRCL 36 (SUTURE) ×4 IMPLANT
SUT PROLENE 5 0 C 1 36 (SUTURE) ×4 IMPLANT
SUT PROLENE 6 0 C 1 30 (SUTURE) ×4 IMPLANT
SUT SILK  1 MH (SUTURE) ×6
SUT SILK 1 MH (SUTURE) ×6 IMPLANT
SUT SILK 1 TIES 10X30 (SUTURE) IMPLANT
SUT SILK 2 0 SH CR/8 (SUTURE) ×6 IMPLANT
SUT SILK 2 0 TIES 10X30 (SUTURE) IMPLANT
SUT SILK 2 0SH CR/8 30 (SUTURE) IMPLANT
SUT SILK 3 0 (SUTURE) ×1
SUT SILK 3 0 SH CR/8 (SUTURE) IMPLANT
SUT SILK 3 0SH CR/8 30 (SUTURE) IMPLANT
SUT SILK 3-0 18XBRD TIE 12 (SUTURE) ×1 IMPLANT
SUT STEEL STERNAL CCS#1 18IN (SUTURE) ×2 IMPLANT
SUT STEEL SZ 6 DBL 3X14 BALL (SUTURE) ×4 IMPLANT
SUT TEM PAC WIRE 2 0 SH (SUTURE) IMPLANT
SUT VIC AB 2-0 CTX 36 (SUTURE) IMPLANT
SUT VIC AB 2-0 UR6 27 (SUTURE) ×4 IMPLANT
SUT VIC AB 3-0 SH 8-18 (SUTURE) ×12 IMPLANT
SUT VICRYL 2 TP 1 (SUTURE) IMPLANT
SUTURE E-PAK OPEN HEART (SUTURE) ×2 IMPLANT
SYRINGE 10CC LL (SYRINGE) IMPLANT
SYSTEM SAHARA CHEST DRAIN ATS (WOUND CARE) ×2 IMPLANT
TAPE CLOTH SURG 4X10 WHT LF (GAUZE/BANDAGES/DRESSINGS) ×2 IMPLANT
TAPE PAPER 2X10 WHT MICROPORE (GAUZE/BANDAGES/DRESSINGS) ×2 IMPLANT
TOWEL OR 17X24 6PK STRL BLUE (TOWEL DISPOSABLE) ×4 IMPLANT
TOWEL OR 17X26 10 PK STRL BLUE (TOWEL DISPOSABLE) ×4 IMPLANT
TRAY FOLEY IC TEMP SENS 14FR (CATHETERS) ×2 IMPLANT
TROCAR XCEL BLADELESS 5X75MML (TROCAR) IMPLANT
TROCAR XCEL NON-BLD 11X100MML (ENDOMECHANICALS) IMPLANT
TUBE SUCT INTRACARD DLP 20F (MISCELLANEOUS) ×4 IMPLANT
TUNNELER SHEATH ON-Q 11GX8 (MISCELLANEOUS) IMPLANT
UNDERPAD 30X30 INCONTINENT (UNDERPADS AND DIAPERS) ×2 IMPLANT
VRC MALLEABLE SINGLE STG 28FR (MISCELLANEOUS) ×2
WATER STERILE IRR 1000ML POUR (IV SOLUTION) ×4 IMPLANT
WIRE BENTSON .035X145CM (WIRE) IMPLANT
YANKAUER SUCT BULB TIP NO VENT (SUCTIONS) ×2 IMPLANT

## 2011-04-17 NOTE — Anesthesia Postprocedure Evaluation (Signed)
  Anesthesia Post-op Note  Patient: Erin Haynes  Procedure(s) Performed:  EXCISION MYXOMA - Excision of Left Atrial Myxoma.  Patient Location: PACU and SICU  Anesthesia Type: General  Level of Consciousness: sedated  Airway and Oxygen Therapy: Patient remains intubated per anesthesia plan  Post-op Pain: mild  Post-op Assessment: Post-op Vital signs reviewed  Post-op Vital Signs: stable  Complications: No apparent anesthesia complications

## 2011-04-17 NOTE — Progress Notes (Signed)
[facility] BRIEF SICU PROGRESS NOTE  S/P Procedure(s) (LRB): EXCISION MYXOMA (N/A)  Subjective: Still sedated on vent.  Objective: Vital signs in last 4 hours: Patient Vitals for the past 4 hrs:  Temp Temp src Pulse Resp SpO2 Height Weight  04/17/11 1530 97.5 F (36.4 C) - 80  12  100 % - -  04/17/11 1515 97.3 F (36.3 C) - 80  12  100 % - -  04/17/11 1500 97.3 F (36.3 C) - 80  14  100 % - -  04/17/11 1445 97.2 F (36.2 C) - 80  12  100 % - -  04/17/11 1430 97.2 F (36.2 C) - 80  12  100 % - -  04/17/11 1415 97.2 F (36.2 C) - 80  12  100 % - -  04/17/11 1400 97 F (36.1 C) Core 80  12  100 % - -  04/17/11 1345 97 F (36.1 C) - 80  17  100 % 5\' 6"  (1.676 m) 110 kg (242 lb 8.1 oz)  04/17/11 1330 97 F (36.1 C) - 80  15  100 % - -    Hemodynamics:  Stable with relatively low PAD and CI approx. 1.6  Physical Exam:  Neuro:   sedated  Breath sounds: clear  CV:   RRR  Abdomen:  soft  Extremities:  warm   Chest tube output:  low Urine output:   excellent  Lab Results: Hemoglobin  Date Value Range Status  04/17/2011 8.8* 12.0-15.0 (g/dL) Final  78/09/6960 8.6* 12.0-15.0 (g/dL) Final     HCT  Date Value Range Status  04/17/2011 26.0* 36.0-46.0 (%) Final  04/17/2011 25.3* 36.0-46.0 (%) Final     Platelets  Date Value Range Status  04/17/2011 138* 150-400 (K/uL) Final     Sodium  Date Value Range Status  04/17/2011 139  135-145 (mEq/L) Final     Potassium  Date Value Range Status  04/17/2011 3.3* 3.5-5.1 (mEq/L) Final     Creatinine, Ser  Date Value Range Status  04/16/2011 1.16* 0.50-1.10 (mg/dL) Final     Assessment/Plan: S/P Procedure(s) (LRB): EXCISION MYXOMA (N/A)  Stable initial post-op.  Needs more volume.  Continue routine postop.

## 2011-04-17 NOTE — Op Note (Signed)
CARDIOTHORACIC SURGERY OPERATIVE NOTE  Date of Procedure: 04/17/2011  Preoperative Diagnosis: Left Atrial Mass Postoperative Diagnosis: Same  Procedure: Resection of Left Atrial Myxoma  Surgeon: Salvatore Decent. Cornelius Moras, MD Assistant: Al Corpus, CSFA Anesthesia: Judie Petit, MD  Brief Clinical Note and Indications for Surgery: Patient is a 70 yo female with recently discovered left atrial mass consistent with atrial myxoma.  Transesophageal echo confirms the presence of a large mass in the left atrium attached to the interatrial septum consistent with atrial myxoma.  Catheterization reveals no significant flow-limiting coronary artery disease.  Left ventricular function is normal.  The patient has been seen in consultation previously and provides full informed consent for the operation as described.  Operative Findings: Large atrial myxoma.  Normal LV function.  Operative Procedure in Detail:  The patient is brought to the operating room on the above mentioned date and placed in the supine position on the operating table. Central monitoring was established by the anesthesia team including placement of Swan-Ganz catheter and radial arterial line. Intravenous antibiotics are administered. General endotracheal anesthesia is induced uneventfully. A Foley catheter is placed.  Baseline transesophageal echocardiogram was performed.  Findings were notable for a large mass consistent with atrial myxoma.  No other significant findings were noted.  The patient's chest, abdomen, both groins, and both lower extremities are prepared and draped in a sterile manner. A time out procedure is performed.  A median sternotomy incision was performed and the pericardium is opened. A portion of the patient's pericardium was removed and tanned in a bath of 0.625% gluteraldehyde solution and rinsed per protocol.  The ascending aorta is normal in appearance. The ascending aorta and the right atrium are cannulated for  cardioplegia bypass.  Adequate heparinization is verified.  A retrograde cardioplegia cannula is placed through the right atrium into the coronary sinus.  Cardioplegia bypass was begun and a second venous cannula is placed directly into the superior vena cava. A cardioplegia cannula is placed in the ascending aorta.  A temperature probe was placed in the interventricular septum.  The patient is cooled to 32C systemic temperature.  The aortic cross clamp is applied and cold blood cardioplegia is delivered in an antegrade fashion through the aortic root and retrograde through the coronary sinus catheter.  Iced saline slush is applied for topical hypothermia.  The initial cardioplegic arrest is rapid with early diastolic arrest.  Repeat doses of cardioplegia are administered intermittently throughout the entire cross clamp portion of the operation though the aortic root and through the coronary sinus catheter in order to maintain completely flat electrocardiogram and septal myocardial temperature below 15C.  Myocardial protection was felt to be excellent.  Tapes are secured around the superior and inferior vena cava.  A right atriotomy is performed and the interatrial septum inspected.  A small left atriotomy was then performed through the interatrial groove.  A large mass was obviously visible within the left atrium.  A right angle clamp was placed into the left atrium and utilized to mark a location on the interatrial septum away from the stalk of the mass.  The interatrial septum was opened sharply.  A large circular portion of the interatrial septum was then excised taking care to remove the entire base of the stalk of the myxoma, and the myxoma is removed in one piece.  The tanned autologous pericardium is trimmed to an appropriate size and sewn into place with running 3-0 Prolene suture as a patch to close the defect in the interatrial  septum.  The patch is sewn directly to the margin of the left atrial  endocardial surface.  A second patch of similar size of Cor-matrix bovine soft tissue patch was sewn to the free margin of the right endocardial surface to create a 2-layer patch closure.  The left atriotomy incision was closed with 3-0 Prolene suture.  One final dose of warm retrograde hot shot cardioplegia was administered.  The aortic cross clamp was removed after a total cross clamp time of 49 minutes.  The right atriotomy incision was closed with running 3-0 Prolene suture.  Epicardial pacing wires are fixed to the right ventricular outflow tract and to the right atrial appendage. The patient is rewarmed to 37C temperature. The patient is weaned and disconnected from cardiopulmonary bypass.  The patient's rhythm at separation from bypass was atrial paced.  The patient was weaned from cardioplegic bypass without any inotropic support. Total cardiopulmonary bypass time for the operation was 86 minutes.  Followup transesophageal echocardiogram performed after separation from bypass revealed no residual interatrial communication.  There remained normal LV function.  No other abnormalities were noted.  The aortic and venous cannula were removed uneventfully. Protamine was administered to reverse the anticoagulation. The mediastinum and pleural space were inspected for hemostasis and irrigated with saline solution. Mediastinum and the right pleural space were drained using 3 chest tubes placed through separate stab incisions inferiorly.  The soft tissues anterior to the aorta were reapproximated loosely. The sternum is closed with double strength sternal wire. The soft tissues anterior to the sternum were closed in multiple layers and the skin is closed with a running subcuticular skin closure.  The patient tolerated the procedure well and is transported to the surgical intensive care in stable condition. There are no intraoperative complications. All sponge instrument and needle counts are verified  correct at completion of the operation.  No blood products were administered.  Salvatore Decent. Cornelius Moras MD

## 2011-04-17 NOTE — Anesthesia Preprocedure Evaluation (Addendum)
Anesthesia Evaluation  Patient identified by MRN, date of birth, ID band Patient awake    Reviewed: Allergy & Precautions, H&P , NPO status , Patient's Chart, lab work & pertinent test results  Airway Mallampati: II      Dental   Pulmonary shortness of breath,          Cardiovascular hypertension, Pt. 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, in the left atrium, 11/12 cath: 40-50% Diagonal lesion, otherwise normal, normal LVF   Neuro/Psych    GI/Hepatic   Endo/Other  Diabetes mellitus-Hyperthyroidism   Renal/GU      Musculoskeletal   Abdominal   Peds  Hematology   Anesthesia Other Findings   Reproductive/Obstetrics                         Anesthesia Physical Anesthesia Plan  ASA: III  Anesthesia Plan: General   Post-op Pain Management:    Induction: Intravenous  Airway Management Planned: Oral ETT  Additional Equipment: Arterial line, TEE and PA Cath  Intra-op Plan:   Post-operative Plan: Post-operative intubation/ventilation  Informed Consent: I have reviewed the patients History and Physical, chart, labs and discussed the procedure including the risks, benefits and alternatives for the proposed anesthesia with the patient or authorized representative who has indicated his/her understanding and acceptance.   Dental advisory given  Plan Discussed with:   Anesthesia Plan Comments:         Anesthesia Quick Evaluation

## 2011-04-17 NOTE — H&P (Signed)
The patient returns for elective surgery today.  There have been no interval changes to their history as previously documented.  I have seen and briefly examined the patient in the preoperative holding area, and there are no changes to report in comparison with their previously documented exam.  All labs and diagnostic tests have been reviewed.  We will proceed with surgery as planned.

## 2011-04-17 NOTE — OR Nursing (Signed)
Patients pericardium soaked in glutaraldehyde 0.625% for 3 minutes and rinsed in 0.9%NaCl per protocol

## 2011-04-17 NOTE — Procedures (Signed)
Extubation Procedure Note  Patient Details:   Name: [name] DOB: Apr 11, 1941 MRN: [id number]   Airway Documentation:  AIRWAYS 8 mm (Active)  Secured at (cm) 22 cm 04/17/2011 12:00 AM     Airway (Active)  Secured at (cm) 22 cm 04/17/2011  2:00 PM  Measured From Lips 04/17/2011  5:50 PM  Secured Location Right 04/17/2011  5:50 PM  Secured By Pink Tape 04/17/2011  5:50 PM  Cuff Pressure (cm H2O) 20 cm H2O 04/17/2011  2:00 PM  Site Condition Dry 04/17/2011  5:50 PM    Evaluation  O2 sats: stable throughout Complications: No apparent complications Patient did tolerate procedure well. Bilateral Breath Sounds: Diminished   Yes  Newt Lukes 04/17/2011, 6:29 PM

## 2011-04-17 NOTE — Transfer of Care (Signed)
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  Patient Location: PACU and SICU  Anesthesia Type: General  Level of Consciousness: unresponsive  Airway & Oxygen Therapy: Patient remains intubated per anesthesia plan and Patient placed on Ventilator (see vital sign flow sheet for setting)  Post-op Assessment: Report given to PACU RN and Post -op Vital signs reviewed and stable  Post vital signs: Reviewed and stable  Complications: No apparent anesthesia complications

## 2011-04-18 ENCOUNTER — Encounter (HOSPITAL_BASED_OUTPATIENT_CLINIC_OR_DEPARTMENT_OTHER): Payer: Self-pay

## 2011-04-18 ENCOUNTER — Inpatient Hospital Stay (HOSPITAL_COMMUNITY): Payer: Medicare Other

## 2011-04-18 LAB — POCT I-STAT 4, (NA,K, GLUC, HGB,HCT)
Glucose, Bld: 184 mg/dL — ABNORMAL HIGH (ref 70–99)
Glucose, Bld: 171 mg/dL — ABNORMAL HIGH (ref 70–99)
Glucose, Bld: 188 mg/dL — ABNORMAL HIGH (ref 70–99)
HCT: 38 % (ref 36.0–46.0)
HCT: 26 % — ABNORMAL LOW (ref 36.0–46.0)
HCT: 22 % — ABNORMAL LOW (ref 36.0–46.0)
Hemoglobin: 7.8 g/dL — ABNORMAL LOW (ref 12.0–15.0)
Hemoglobin: 8.5 g/dL — ABNORMAL LOW (ref 12.0–15.0)
Potassium: 3.7 mEq/L (ref 3.5–5.1)
Potassium: 4.2 mEq/L (ref 3.5–5.1)
Potassium: 3.5 mEq/L (ref 3.5–5.1)
Sodium: 140 mEq/L (ref 135–145)
Sodium: 137 mEq/L (ref 135–145)
Sodium: 138 mEq/L (ref 135–145)

## 2011-04-18 LAB — BASIC METABOLIC PANEL
BUN: 12 mg/dL (ref 6–23)
CO2: 27 mEq/L (ref 19–32)
Calcium: 9.4 mg/dL (ref 8.4–10.5)
Chloride: 101 mEq/L (ref 96–112)
Creatinine, Ser: 0.91 mg/dL (ref 0.50–1.10)
GFR calc Af Amer: 72 mL/min — ABNORMAL LOW (ref >90)
GFR calc non Af Amer: 62 mL/min — ABNORMAL LOW (ref >90)
Glucose, Bld: 135 mg/dL — ABNORMAL HIGH (ref 70–99)
Potassium: 3.4 mEq/L — ABNORMAL LOW (ref 3.5–5.1)
Sodium: 141 mEq/L (ref 135–145)

## 2011-04-18 LAB — CBC
HCT: 23.8 % — ABNORMAL LOW (ref 36.0–46.0)
Hemoglobin: 8.3 g/dL — ABNORMAL LOW (ref 12.0–15.0)
MCH: 28.4 pg (ref 26.0–34.0)
MCHC: 34.4 g/dL (ref 30.0–36.0)
MCHC: 34 g/dL (ref 30.0–36.0)
MCV: 82.5 fL (ref 78.0–100.0)
MCV: 82.4 fL (ref 78.0–100.0)
Platelets: 133 10*3/uL — ABNORMAL LOW (ref 150–400)
Platelets: 126 10*3/uL — ABNORMAL LOW (ref 150–400)
RDW: 14.2 % (ref 11.5–15.5)

## 2011-04-18 LAB — CREATININE, SERUM: GFR calc non Af Amer: 54 mL/min — ABNORMAL LOW (ref >90)

## 2011-04-18 LAB — GLUCOSE, CAPILLARY
Glucose-Capillary: 149 mg/dL — ABNORMAL HIGH (ref 70–99)
Glucose-Capillary: 135 mg/dL — ABNORMAL HIGH (ref 70–99)
Glucose-Capillary: 116 mg/dL — ABNORMAL HIGH (ref 70–99)
Glucose-Capillary: 144 mg/dL — ABNORMAL HIGH (ref 70–99)
Glucose-Capillary: 177 mg/dL — ABNORMAL HIGH (ref 70–99)
Glucose-Capillary: 145 mg/dL — ABNORMAL HIGH (ref 70–99)
Glucose-Capillary: 98 mg/dL (ref 70–99)
Glucose-Capillary: 207 mg/dL — ABNORMAL HIGH (ref 70–99)

## 2011-04-18 LAB — POCT I-STAT 3, ART BLOOD GAS (G3+)
Acid-Base Excess: 3 mmol/L — ABNORMAL HIGH (ref 0.0–2.0)
Bicarbonate: 26.9 mEq/L — ABNORMAL HIGH (ref 20.0–24.0)
TCO2: 28 mmol/L (ref 0–100)
pO2, Arterial: 352 mmHg — ABNORMAL HIGH (ref 80.0–100.0)

## 2011-04-18 LAB — MAGNESIUM
Magnesium: 2.1 mg/dL (ref 1.5–2.5)
Magnesium: 1.9 mg/dL (ref 1.5–2.5)

## 2011-04-18 LAB — POCT I-STAT, CHEM 8
BUN: 13 mg/dL (ref 6–23)
Creatinine, Ser: 1.2 mg/dL — ABNORMAL HIGH (ref 0.50–1.10)
Potassium: 3.3 mEq/L — ABNORMAL LOW (ref 3.5–5.1)

## 2011-04-18 MED ORDER — FUROSEMIDE 10 MG/ML IJ SOLN
20.0000 mg | Freq: Four times a day (QID) | INTRAMUSCULAR | Status: AC
  Administered 2011-04-18 (×3): 20 mg via INTRAVENOUS
  Filled 2011-04-18 (×3): qty 2

## 2011-04-18 MED ORDER — INSULIN ASPART 100 UNIT/ML ~~LOC~~ SOLN
0.0000 [IU] | SUBCUTANEOUS | Status: DC
  Administered 2011-04-18: 4 [IU] via SUBCUTANEOUS
  Administered 2011-04-19: 8 [IU] via SUBCUTANEOUS
  Administered 2011-04-19 (×2): 2 [IU] via SUBCUTANEOUS
  Administered 2011-04-19 (×2): 4 [IU] via SUBCUTANEOUS
  Administered 2011-04-19: 2 [IU] via SUBCUTANEOUS

## 2011-04-18 MED ORDER — METOPROLOL TARTRATE 25 MG/10 ML ORAL SUSPENSION
25.0000 mg | Freq: Two times a day (BID) | ORAL | Status: DC
  Filled 2011-04-18 (×6): qty 10

## 2011-04-18 MED ORDER — INSULIN GLARGINE 100 UNIT/ML ~~LOC~~ SOLN
36.0000 [IU] | Freq: Two times a day (BID) | SUBCUTANEOUS | Status: DC
  Administered 2011-04-18 – 2011-04-22 (×9): 36 [IU] via SUBCUTANEOUS
  Filled 2011-04-18 (×2): qty 3

## 2011-04-18 MED ORDER — METOPROLOL TARTRATE 25 MG PO TABS
25.0000 mg | ORAL_TABLET | Freq: Two times a day (BID) | ORAL | Status: DC
  Administered 2011-04-18 – 2011-04-20 (×5): 25 mg via ORAL
  Filled 2011-04-18 (×6): qty 1

## 2011-04-18 MED ORDER — MIDAZOLAM HCL 2 MG/2ML IJ SOLN
INTRAMUSCULAR | Status: AC
  Administered 2011-04-18: 2 mg
  Filled 2011-04-18: qty 2

## 2011-04-18 MED ORDER — INSULIN GLARGINE 100 UNIT/ML ~~LOC~~ SOLN
36.0000 [IU] | SUBCUTANEOUS | Status: DC
  Filled 2011-04-18: qty 3

## 2011-04-18 MED ORDER — INSULIN REGULAR HUMAN 100 UNIT/ML IJ SOLN
INTRAMUSCULAR | Status: DC | PRN
  Filled 2011-04-18: qty 1

## 2011-04-18 MED ORDER — ROSUVASTATIN CALCIUM 10 MG PO TABS
10.0000 mg | ORAL_TABLET | Freq: Every day | ORAL | Status: DC
  Administered 2011-04-18 – 2011-04-24 (×7): 10 mg via ORAL
  Filled 2011-04-18 (×7): qty 1

## 2011-04-18 MED ORDER — SODIUM CHLORIDE 0.9 % IV SOLN
INTRAVENOUS | Status: DC | PRN
  Filled 2011-04-18: qty 1

## 2011-04-18 MED ORDER — POTASSIUM CHLORIDE 10 MEQ/50ML IV SOLN
10.0000 meq | INTRAVENOUS | Status: AC
  Administered 2011-04-18 (×3): 10 meq via INTRAVENOUS

## 2011-04-18 MED ORDER — INSULIN ASPART 100 UNIT/ML ~~LOC~~ SOLN
0.0000 [IU] | SUBCUTANEOUS | Status: DC
  Filled 2011-04-18: qty 3

## 2011-04-18 MED ORDER — LISINOPRIL 20 MG PO TABS
20.0000 mg | ORAL_TABLET | Freq: Two times a day (BID) | ORAL | Status: DC
  Administered 2011-04-18 – 2011-04-24 (×13): 20 mg via ORAL
  Filled 2011-04-18 (×14): qty 1

## 2011-04-18 MED ORDER — POTASSIUM CHLORIDE 10 MEQ/50ML IV SOLN
10.0000 meq | INTRAVENOUS | Status: AC
  Administered 2011-04-18 (×4): 10 meq via INTRAVENOUS
  Filled 2011-04-18 (×2): qty 50

## 2011-04-18 MED ORDER — POTASSIUM CHLORIDE 10 MEQ/50ML IV SOLN
10.0000 meq | INTRAVENOUS | Status: DC
Start: 2011-04-18 — End: 2011-04-18
  Administered 2011-04-18 (×2): 10 meq via INTRAVENOUS
  Filled 2011-04-18 (×2): qty 50

## 2011-04-18 MED ORDER — POTASSIUM CHLORIDE 10 MEQ/50ML IV SOLN
INTRAVENOUS | Status: AC
  Filled 2011-04-18: qty 150

## 2011-04-18 MED ORDER — LEVOTHYROXINE SODIUM 137 MCG PO TABS
137.0000 ug | ORAL_TABLET | Freq: Every day | ORAL | Status: DC
  Administered 2011-04-18 – 2011-04-24 (×7): 137 ug via ORAL
  Filled 2011-04-18 (×7): qty 1

## 2011-04-18 MED FILL — Cefuroxime Sodium For Inj 1.5 GM: INTRAMUSCULAR | Qty: 1.5 | Status: AC

## 2011-04-18 NOTE — Progress Notes (Signed)
Inpatient Diabetes Program Recommendations  AACE/ADA: New Consensus Statement on Inpatient Glycemic Control (2009)  Target Ranges:  Prepandial:   less than 140 mg/dL      Peak postprandial:   less than 180 mg/dL (1-2 hours)      Critically ill patients:  140 - 180 mg/dL   Reason for Visit: IV insulin  Inpatient Diabetes Program Recommendations Insulin - IV drip/GlucoStabilizer: Consider continuation of IV insulin due to high insulin drip rates. HgbA1C: A1c=6.7%  Note:

## 2011-04-18 NOTE — Progress Notes (Signed)
  Resting comfortably,NSR o2 sat 96%, last cbg 202 on SSI

## 2011-04-18 NOTE — Progress Notes (Signed)
[facility] PROGRESS NOTE   1 Day Post-Op Procedure(s): EXCISION MYXOMA   Subjective: Feels sore in chest but otherwise ok.  No SOB. No nausea.  Objective: Vital signs for last 24 hours: Filed Vitals:   04/18/11 0700  BP: 140/65  Pulse: 86  Temp: 99.5 F (37.5 C)  Resp: 29    Hemodynamic parameters for last 24 hours: PAP: (27-41)/(12-28) 27/12 mmHg CO:  [3 L/min-5.1 L/min] 5.1 L/min CI:  [1.4 L/min/m2-2.4 L/min/m2] 2.4 L/min/m2  Physical Exam:  Rhythm:   sinus  Breath sounds: clear  Heart sounds:  RRR  Incisions:  Dressings dry  Abdomen:  Soft, non-tender  Extremities:  warm  Neuro:   Non-focal  Intake/Output from previous day: 11/08 0701 - 11/09 0700 In: 5418.3 [P.O.:240; I.V.:3832.3; Blood:216; NG/GT:30; IV Piggyback:1100] Out: 5750 [Urine:4200; Blood:1150; Chest Tube:400] Intake/Output this shift:    Lab Results:  Basename 04/18/11 0407 04/17/11 1925 04/17/11 1920  WBC 12.2* -- 12.7*  HGB 8.3* 8.5* --  HCT 24.1* 25.0* --  PLT 133* -- 140*   BMET:  Basename 04/18/11 0407 04/17/11 1925 04/16/11 1005  NA 141 140 --  K 3.4* 4.0 --  CL 101 103 --  CO2 27 -- 27  GLUCOSE 135* 145* --  BUN 12 14 --  CREATININE 0.91 0.90 --  CALCIUM 9.4 -- 10.3    CBG (last 3)   Basename 04/18/11 0728 04/18/11 0615 04/18/11 0523  GLUCAP 109* 118* 141*   ABG    Component Value Date/Time   PHART 7.390 04/17/2011 1922   HCO3 26.8* 04/17/2011 1922   TCO2 26 04/17/2011 1925   O2SAT 96.0 04/17/2011 1922     Assessment/Plan: S/P Procedure(s) (LRB): EXCISION MYXOMA (N/A)  Doing well POD 1.  BP elevated somewhat on IV NTG.  Mild postop atelectasis.  Mobilize. Diuresis. Restart BP meds. Transfer step down.

## 2011-04-18 NOTE — Progress Notes (Signed)
Subjective:  The patient is doing very well following her left atrial myxoma removal yesterday.  Rhythm stable NSR.Patient is alert, oriented. Usual amount of incisional pain  Objective:  Vital Signs in the last 24 hours: Temp:  [97 F (36.1 C)-100.6 F (38.1 C)] 99.5 F (37.5 C) (11/09 0700) Pulse Rate:  [60-87] 86  (11/09 0700) Resp:  [12-29] 29  (11/09 0700) BP: (109-154)/(61-82) 140/65 mmHg (11/09 0700) SpO2:  [94 %-100 %] 97 % (11/09 0700) FiO2 (%):  [40 %-50 %] 40 % (11/08 1750) Weight:  [242 lb 8.1 oz (110 kg)-255 lb 4.7 oz (115.8 kg)] 255 lb 4.7 oz (115.8 kg) (11/09 0600)  Intake/Output from previous day: 11/08 0701 - 11/09 0700 In: 5418.3 [P.O.:240; I.V.:3832.3; Blood:216; NG/GT:30; IV Piggyback:1100] Out: 5750 [Urine:4200; Blood:1150; Chest Tube:400] Intake/Output from this shift:       . acetaminophen (TYLENOL) oral liquid 160 mg/5 mL  650 mg Per Tube NOW   Or  . acetaminophen  650 mg Rectal NOW  . acetaminophen  1,000 mg Oral Q6H   Or  . acetaminophen (TYLENOL) oral liquid 160 mg/5 mL  975 mg Per Tube Q6H  . aspirin EC  325 mg Oral Daily   Or  . aspirin  324 mg Per Tube Daily  . bisacodyl  10 mg Oral Daily   Or  . bisacodyl  10 mg Rectal Daily  . cefUROXime (ZINACEF) IV  1.5 g Intravenous Q12H  . docusate sodium  200 mg Oral Daily  . heparin-papaverine-plasmalyte irrigation   Irrigation To OR  . insulin aspart  0-24 Units Subcutaneous Q2H   Followed by  . insulin aspart  0-24 Units Subcutaneous Q4H  . levothyroxine  137 mcg Oral Daily  . magnesium sulfate infusion  4 g Intravenous Once  . metoCLOPramide  10 mg Oral Q6H  . metoprolol tartrate  12.5 mg Oral BID   Or  . metoprolol tartrate  12.5 mg Per Tube BID  . pantoprazole  40 mg Oral Q1200  . potassium chloride  10 mEq Intravenous Q1 Hr x 2  . potassium chloride  10 mEq Intravenous Q1 Hr x 3  . rosuvastatin  10 mg Oral Daily  . sodium chloride  3 mL Intravenous Q12H  . vancomycin (VANCOCIN) IVPB  1000 mg/100 mL central line  1,000 mg Intravenous Once  . DISCONTD: acetaminophen (TYLENOL) oral liquid 160 mg/5 mL  975 mg Per Tube Q6H  . DISCONTD: acetaminophen  1,000 mg Oral Q6H  . DISCONTD: aminocaproic acid (AMICAR) for OHS   Intravenous To OR  . DISCONTD: cefUROXime (ZINACEF) IV  1.5 g Intravenous To OR  . DISCONTD: cefUROXime (ZINACEF) IV  1.5 g Intravenous Q12H  . DISCONTD: cefUROXime (ZINACEF) IV  750 mg Intravenous To OR  . DISCONTD: dexmedetomidine (PRECEDEX) IV infusion for high rates  0.1-0.7 mcg/kg/hr Intravenous To OR  . DISCONTD: DOPamine  2-20 mcg/kg/min Intravenous To OR  . DISCONTD: epinephrine  0.5-20 mcg/min Intravenous To OR  . DISCONTD: famotidine (PEPCID) IV  20 mg Intravenous Q12H  . DISCONTD: insulin (NOVOLIN-R) infusion   Intravenous To OR  . DISCONTD: magnesium sulfate  40 mEq Other To OR  . DISCONTD: metoprolol tartrate  12.5 mg Oral Once  . DISCONTD: nitroGLYCERIN  2-200 mcg/min Intravenous To OR  . DISCONTD: phenylephrine (NEO-SYNEPHRINE) infusion  30-200 mcg/min Intravenous To OR  . DISCONTD: potassium chloride  10 mEq Intravenous Q1 Hr x 3  . DISCONTD: potassium chloride  80 mEq Other To OR  .  DISCONTD: vancomycin (VANCOCIN) IVPB 1000 mg/100 mL central line  1,000 mg Intravenous Once  . DISCONTD: vancomycin  1,500 mg Intravenous To OR      . sodium chloride 20 mL/hr at 04/18/11 0500  . sodium chloride    . sodium chloride    . dexmedetomidine (PRECEDEX) IV infusion Stopped (04/17/11 1642)  . insulin (NOVOLIN-R) infusion 5.8 Units/hr (04/18/11 0600)  . lactated ringers Stopped (04/18/11 0626)  . lactated ringers Stopped (04/17/11 2300)  . nitroGLYCERIN 75 mcg/min (04/18/11 0700)  . phenylephrine (NEO-SYNEPHRINE) infusion Stopped (04/17/11 1700)    Physical Exam: Patient is alert and oriented.  JVP slightly elevated.Heart reveals soft pericardial slosh, no gallop. Chest reveals decreased breath sounds at bases. Abdomen soft and  nontender. Extremities reveal pedal pulses intact, no phlebitis.  Lab Results:  Basename 04/18/11 0407 04/17/11 1925 04/17/11 1920  WBC 12.2* -- 12.7*  HGB 8.3* 8.5* --  PLT 133* -- 140*    Basename 04/18/11 0407 04/17/11 1925 04/16/11 1005  NA 141 140 --  K 3.4* 4.0 --  CL 101 103 --  CO2 27 -- 27  GLUCOSE 135* 145* --  BUN 12 14 --  CREATININE 0.91 0.90 --   No results found for this basename: TROPONINI:2,CK,MB:2 in the last 72 hours Hepatic Function Panel  Basename 04/16/11 1005  PROT 7.5  ALBUMIN 3.9  AST 28  ALT 24  ALKPHOS 81  BILITOT 0.3  BILIDIR --  IBILI --   No results found for this basename: CHOL in the last 72 hours No results found for this basename: PROTIME in the last 72 hours  Imaging:   Cardiac Studies:  Assessment/Plan:  Post-op removal of left atrial myxoma, doing well. Diabetes mellitus, on insulin drip. Hypokalemia.  Plan:  Continue present cardiac meds.  LOS: 1 day    Cassell Clement 04/18/2011, 7:34 AM

## 2011-04-18 NOTE — Progress Notes (Signed)
UR Completed.  Syra Sirmons Jane 04/18/2011  

## 2011-04-19 ENCOUNTER — Encounter (HOSPITAL_COMMUNITY): Payer: Self-pay

## 2011-04-19 ENCOUNTER — Inpatient Hospital Stay (HOSPITAL_COMMUNITY): Payer: Medicare Other

## 2011-04-19 DIAGNOSIS — D151 Benign neoplasm of heart: Secondary | ICD-10-CM

## 2011-04-19 LAB — CBC
HCT: 21.6 % — ABNORMAL LOW (ref 36.0–46.0)
Hemoglobin: 7.3 g/dL — ABNORMAL LOW (ref 12.0–15.0)
MCH: 28.3 pg (ref 26.0–34.0)
MCHC: 33.8 g/dL (ref 30.0–36.0)
MCV: 83.7 fL (ref 78.0–100.0)
Platelets: 130 10*3/uL — ABNORMAL LOW (ref 150–400)
RBC: 2.58 MIL/uL — ABNORMAL LOW (ref 3.87–5.11)
RDW: 14.3 % (ref 11.5–15.5)
WBC: 13.4 10*3/uL — ABNORMAL HIGH (ref 4.0–10.5)

## 2011-04-19 LAB — GLUCOSE, CAPILLARY: Glucose-Capillary: 166 mg/dL — ABNORMAL HIGH (ref 70–99)

## 2011-04-19 LAB — BASIC METABOLIC PANEL
CO2: 30 mEq/L (ref 19–32)
Calcium: 8.8 mg/dL (ref 8.4–10.5)
Sodium: 136 mEq/L (ref 135–145)

## 2011-04-19 MED ORDER — FUROSEMIDE 10 MG/ML IJ SOLN
20.0000 mg | Freq: Once | INTRAMUSCULAR | Status: AC
  Administered 2011-04-19: 20 mg via INTRAVENOUS

## 2011-04-19 MED ORDER — POTASSIUM CHLORIDE 10 MEQ/50ML IV SOLN
10.0000 meq | INTRAVENOUS | Status: AC
  Administered 2011-04-19 (×3): 10 meq via INTRAVENOUS
  Filled 2011-04-19 (×3): qty 50

## 2011-04-19 MED ORDER — WARFARIN SODIUM 5 MG IV SOLR
5.0000 mg | Freq: Once | INTRAVENOUS | Status: DC
  Filled 2011-04-19: qty 2.5

## 2011-04-19 MED ORDER — FUROSEMIDE 10 MG/ML IJ SOLN
20.0000 mg | Freq: Four times a day (QID) | INTRAMUSCULAR | Status: AC
  Administered 2011-04-19 (×3): 20 mg via INTRAVENOUS
  Filled 2011-04-19 (×4): qty 2

## 2011-04-19 NOTE — Progress Notes (Signed)
2 Days Post-Op Procedure(s) (LRB): EXCISION MYXOMA (N/A) Subjective:                                                        Some SOB ,NSR I/O positive lasix ordered            Objective: Vital signs in last 24 hours: Temp:  [98.4 F (36.9 C)-100 F (37.8 C)] 99.7 F (37.6 C) (11/10 1600) Pulse Rate:  [71-114] 88  (11/10 1900) Cardiac Rhythm:  [-] Normal sinus rhythm (11/10 1800) Resp:  [15-29] 23  (11/10 1900) BP: (109-173)/(51-73) 123/59 mmHg (11/10 1900) SpO2:  [92 %-98 %] 97 % (11/10 1900) Weight:  [252 lb 13.9 oz (114.7 kg)] 252 lb 13.9 oz (114.7 kg) (11/10 0500)  Hemodynamic parameters for last 24 hours:    Intake/Output from previous day: 11/09 0701 - 11/10 0700 In: 1748.7 [P.O.:1020; I.V.:528.7; IV Piggyback:200] Out: 3300 [Urine:3300] Intake/Output this shift:      Lab Results:  Basename 04/19/11 0400 04/18/11 1735 04/18/11 1715  WBC 13.4* -- 13.5*  HGB 7.3* 8.8* --  HCT 21.6* 26.0* --  PLT 130* -- 126*   BMET:  Basename 04/19/11 0400 04/18/11 1735 04/18/11 0407  NA 136 138 --  K 3.9 3.3* --  CL 98 96 --  CO2 30 -- 27  GLUCOSE 148* 84 --  BUN 14 13 --  CREATININE 1.13* 1.20* --  CALCIUM 8.8 -- 9.4    PT/INR:  Basename 04/17/11 1320  LABPROT 17.8*  INR 1.44   ABG    Component Value Date/Time   PHART 7.390 04/17/2011 1922   HCO3 26.8* 04/17/2011 1922   TCO2 28 04/18/2011 1735   O2SAT 96.0 04/17/2011 1922   CBG (last 3)   Basename 04/19/11 1524 04/19/11 1123 04/19/11 0733  GLUCAP 166* 246* 141*    Assessment/Plan: S/P Procedure(s) (LRB): EXCISION MYXOMA (N/A) Lasix ordered   LOS: 2 days    VAN TRIGT Haynes,Erin 04/19/2011

## 2011-04-19 NOTE — Progress Notes (Signed)
Subjective:  The patient is doing well.  Has walked in hall.  Rhythm is NSR.  Diabetes satisfactory control on Lantus insulin.  Objective:  Vital Signs in the last 24 hours: Temp:  [98.4 F (36.9 C)-100.2 F (37.9 C)] 98.4 F (36.9 C) (11/10 0400) Pulse Rate:  [71-90] 90  (11/10 0900) Resp:  [15-26] 22  (11/10 0900) BP: (109-173)/(57-81) 147/65 mmHg (11/10 0900) SpO2:  [89 %-98 %] 95 % (11/10 0900) Weight:  [252 lb 13.9 oz (114.7 kg)] 252 lb 13.9 oz (114.7 kg) (11/10 0500)  Intake/Output from previous day: 11/09 0701 - 11/10 0700 In: 1748.7 [P.O.:1020; I.V.:528.7; IV Piggyback:200] Out: 3300 [Urine:3300] Intake/Output from this shift: Total I/O In: 92 [I.V.:40; IV Piggyback:52] Out: 225 [Urine:225]     . acetaminophen  1,000 mg Oral Q6H   Or  . acetaminophen (TYLENOL) oral liquid 160 mg/5 mL  975 mg Per Tube Q6H  . aspirin EC  325 mg Oral Daily   Or  . aspirin  324 mg Per Tube Daily  . bisacodyl  10 mg Oral Daily   Or  . bisacodyl  10 mg Rectal Daily  . cefUROXime (ZINACEF) IV  1.5 g Intravenous Q12H  . docusate sodium  200 mg Oral Daily  . furosemide  20 mg Intravenous Q6H  . furosemide  20 mg Intravenous Q6H  . insulin aspart  0-24 Units Subcutaneous Q2H  . insulin aspart  0-24 Units Subcutaneous Q4H  . insulin glargine  36 Units Subcutaneous BID  . levothyroxine  137 mcg Oral Daily  . lisinopril  20 mg Oral BID  . metoCLOPramide  10 mg Oral Q6H  . metoprolol tartrate  25 mg Oral BID   Or  . metoprolol tartrate  25 mg Per Tube BID  . midazolam      . pantoprazole  40 mg Oral Q1200  . potassium chloride  10 mEq Intravenous Q1 Hr x 5  . potassium chloride  10 mEq Intravenous Q1 Hr x 3  . potassium chloride  10 mEq Intravenous Q1 Hr x 3  . rosuvastatin  10 mg Oral Daily  . sodium chloride  3 mL Intravenous Q12H  . DISCONTD: insulin aspart  0-24 Units Subcutaneous Q4H  . DISCONTD: insulin aspart  0-24 Units Subcutaneous Q4H  . DISCONTD: insulin glargine  36  Units Subcutaneous QAM  . DISCONTD: warfarin  5 mg Intravenous Once      . insulin (NOVOLIN-R) infusion    . lactated ringers 20 mL/hr at 04/19/11 0800  . DISCONTD: sodium chloride 20 mL/hr at 04/18/11 1300  . DISCONTD: sodium chloride    . DISCONTD: sodium chloride    . DISCONTD: dexmedetomidine (PRECEDEX) IV infusion Stopped (04/17/11 1642)  . DISCONTD: insulin (NOVOLIN-R) infusion 4.2 Units/hr (04/18/11 1600)  . DISCONTD: insulin (NOVOLIN-R) infusion    . DISCONTD: nitroGLYCERIN 5 mcg/min (04/19/11 0911)    Physical Exam: The patient appears to be in no distress.  Head and neck exam reveals that the pupils are equal and reactive.  The extraocular movements are full.  There is no scleral icterus.  Mouth and pharynx are benign.  No lymphadenopathy.  No carotid bruits.  The jugular venous pressure is normal.  Thyroid is not enlarged or tender.  Chest reveals decreased breath sounds both bases.  No wheezing.  Heart reveals no abnormal lift or heave.  First and second heart sounds are normal.  There is no murmur gallop rub or click.  The abdomen is soft and nontender.  Bowel sounds are normoactive.  There is no hepatosplenomegaly or mass.  There are no abdominal bruits.  Extremities reveal no phlebitis or edema.  Pedal pulses are good.  There is no cyanosis or clubbing.  Neurologic exam is normal strength and no lateralizing weakness.  No sensory deficits.  Integument reveals no rash  Lab Results:  Basename 04/19/11 0400 04/18/11 1735 04/18/11 1715  WBC 13.4* -- 13.5*  HGB 7.3* 8.8* --  PLT 130* -- 126*    Basename 04/19/11 0400 04/18/11 1735 04/18/11 0407  NA 136 138 --  K 3.9 3.3* --  CL 98 96 --  CO2 30 -- 27  GLUCOSE 148* 84 --  BUN 14 13 --  CREATININE 1.13* 1.20* --   No results found for this basename: TROPONINI:2,CK,MB:2 in the last 72 hours Hepatic Function Panel  Basename 04/16/11 1005  PROT 7.5  ALBUMIN 3.9  AST 28  ALT 24  ALKPHOS 81  BILITOT 0.3    BILIDIR --  IBILI --   No results found for this basename: CHOL in the last 72 hours No results found for this basename: PROTIME in the last 72 hours  Imaging: Imaging results have been reviewed  Cardiac Studies:  Assessment/Plan:  Satisfactory post-op course following removal of Left atrial myxoma  Plan: Aggressive pulmonary toilet.  Continue current meds  LOS: 2 days    Cassell Clement 04/19/2011, 9:38 AM

## 2011-04-19 NOTE — Progress Notes (Signed)
2 Days Post-Op Procedure(s) (LRB): EXCISION MYXOMA (N/A)                  [address]            [place] [address]          [phone]         Subjective: Walked 100 ft in ICU    Anemia better Hb8.2        Objective: Vital signs in last 24 hours: Temp:  [98.4 F (36.9 C)-100.2 F (37.9 C)] 98.4 F (36.9 C) (11/10 0400) Pulse Rate:  [71-90] 81  (11/10 0600) Cardiac Rhythm:  [-] Normal sinus rhythm (11/10 0400) Resp:  [15-26] 19  (11/10 0600) BP: (109-173)/(57-81) 117/59 mmHg (11/10 0600) SpO2:  [89 %-98 %] 96 % (11/10 0600) Weight:  [252 lb 13.9 oz (114.7 kg)] 252 lb 13.9 oz (114.7 kg) (11/10 0500)  Hemodynamic parameters for last 24 hours: PAP: (29-44)/(13-27) 44/27 mmHg  Intake/Output from previous day: 11/09 0701 - 11/10 0700 In: 1748.7 [P.O.:1020; I.V.:528.7; IV Piggyback:200] Out: 3300 [Urine:3300] Intake/Output this shift:    OOB to chair  NSR  O2 sat 96% Lungs clear CBG's OK Lab Results:  Basename 04/19/11 0400 04/18/11 1735 04/18/11 1715  WBC 13.4* -- 13.5*  HGB 7.3* 8.8* --  HCT 21.6* 26.0* --  PLT 130* -- 126*   BMET:  Basename 04/19/11 0400 04/18/11 1735 04/18/11 0407  NA 136 138 --  K 3.9 3.3* --  CL 98 96 --  CO2 30 -- 27  GLUCOSE 148* 84 --  BUN 14 13 --  CREATININE 1.13* 1.20* --  CALCIUM 8.8 -- 9.4    PT/INR:  Basename 04/17/11 1320  LABPROT 17.8*  INR 1.44   ABG    Component Value Date/Time   PHART 7.390 04/17/2011 1922   HCO3 26.8* 04/17/2011 1922   TCO2 28 04/18/2011 1735   O2SAT 96.0 04/17/2011 1922   CBG (last 3)   Basename 04/19/11 0733 04/19/11 0359 04/19/11 0011  GLUCAP 141* 150* 156*    Assessment/Plan: S/P Procedure(s) (LRB): EXCISION MYXOMA (N/A) Keep in ICU  Start coumadin Trans- fuse for Hg < 7.0  LOS: 2 days    VAN TRIGT III,Adlai Sinning 04/19/2011

## 2011-04-20 ENCOUNTER — Inpatient Hospital Stay (HOSPITAL_COMMUNITY): Payer: Medicare Other

## 2011-04-20 DIAGNOSIS — E1165 Type 2 diabetes mellitus with hyperglycemia: Secondary | ICD-10-CM

## 2011-04-20 DIAGNOSIS — IMO0001 Reserved for inherently not codable concepts without codable children: Secondary | ICD-10-CM

## 2011-04-20 DIAGNOSIS — I4892 Unspecified atrial flutter: Secondary | ICD-10-CM

## 2011-04-20 LAB — COMPREHENSIVE METABOLIC PANEL
ALT: 20 U/L (ref 0–35)
AST: 32 U/L (ref 0–37)
Albumin: 2.9 g/dL — ABNORMAL LOW (ref 3.5–5.2)
Alkaline Phosphatase: 48 U/L (ref 39–117)
BUN: 19 mg/dL (ref 6–23)
CO2: 31 mEq/L (ref 19–32)
Calcium: 9.1 mg/dL (ref 8.4–10.5)
Chloride: 101 mEq/L (ref 96–112)
Creatinine, Ser: 1.06 mg/dL (ref 0.50–1.10)
GFR calc Af Amer: 60 mL/min — ABNORMAL LOW (ref >90)
GFR calc non Af Amer: 52 mL/min — ABNORMAL LOW (ref >90)
Glucose, Bld: 91 mg/dL (ref 70–99)
Potassium: 3.4 mEq/L — ABNORMAL LOW (ref 3.5–5.1)
Sodium: 139 mEq/L (ref 135–145)
Total Bilirubin: 0.4 mg/dL (ref 0.3–1.2)
Total Protein: 6 g/dL (ref 6.0–8.3)

## 2011-04-20 LAB — GLUCOSE, CAPILLARY
Glucose-Capillary: 89 mg/dL (ref 70–99)
Glucose-Capillary: 145 mg/dL — ABNORMAL HIGH (ref 70–99)
Glucose-Capillary: 133 mg/dL — ABNORMAL HIGH (ref 70–99)
Glucose-Capillary: 101 mg/dL — ABNORMAL HIGH (ref 70–99)

## 2011-04-20 LAB — CBC
HCT: 21.7 % — ABNORMAL LOW (ref 36.0–46.0)
Hemoglobin: 7.2 g/dL — ABNORMAL LOW (ref 12.0–15.0)
MCH: 27.9 pg (ref 26.0–34.0)
MCHC: 33.2 g/dL (ref 30.0–36.0)
MCV: 84.1 fL (ref 78.0–100.0)
Platelets: 146 10*3/uL — ABNORMAL LOW (ref 150–400)
RBC: 2.58 MIL/uL — ABNORMAL LOW (ref 3.87–5.11)
RDW: 14.6 % (ref 11.5–15.5)
WBC: 12.7 10*3/uL — ABNORMAL HIGH (ref 4.0–10.5)

## 2011-04-20 LAB — PROTIME-INR
INR: 1.21 (ref 0.00–1.49)
Prothrombin Time: 15.6 seconds — ABNORMAL HIGH (ref 11.6–15.2)

## 2011-04-20 MED ORDER — FUROSEMIDE 10 MG/ML IJ SOLN
40.0000 mg | Freq: Two times a day (BID) | INTRAMUSCULAR | Status: DC
  Administered 2011-04-21 – 2011-04-23 (×6): 40 mg via INTRAVENOUS
  Filled 2011-04-20 (×10): qty 4

## 2011-04-20 MED ORDER — INSULIN ASPART 100 UNIT/ML ~~LOC~~ SOLN
0.0000 [IU] | Freq: Three times a day (TID) | SUBCUTANEOUS | Status: DC
  Administered 2011-04-20 – 2011-04-21 (×3): 2 [IU] via SUBCUTANEOUS
  Administered 2011-04-21: 4 [IU] via SUBCUTANEOUS

## 2011-04-20 MED ORDER — GUAIFENESIN ER 600 MG PO TB12
600.0000 mg | ORAL_TABLET | Freq: Two times a day (BID) | ORAL | Status: DC | PRN
  Filled 2011-04-20: qty 1

## 2011-04-20 MED ORDER — METOPROLOL TARTRATE 25 MG PO TABS
25.0000 mg | ORAL_TABLET | Freq: Two times a day (BID) | ORAL | Status: DC
  Administered 2011-04-20 – 2011-04-21 (×3): 25 mg via ORAL
  Filled 2011-04-20 (×3): qty 1

## 2011-04-20 MED ORDER — POVIDONE-IODINE 10 % EX SOLN
1.0000 "application " | Freq: Two times a day (BID) | CUTANEOUS | Status: DC
  Administered 2011-04-20 – 2011-04-24 (×9): 1 via TOPICAL

## 2011-04-20 MED ORDER — POTASSIUM CHLORIDE CRYS ER 20 MEQ PO TBCR
20.0000 meq | EXTENDED_RELEASE_TABLET | Freq: Once | ORAL | Status: DC
  Filled 2011-04-20: qty 1

## 2011-04-20 MED ORDER — POTASSIUM CHLORIDE 10 MEQ/50ML IV SOLN
10.0000 meq | INTRAVENOUS | Status: AC
  Administered 2011-04-20 (×3): 10 meq via INTRAVENOUS
  Filled 2011-04-20 (×3): qty 50

## 2011-04-20 MED ORDER — OXYCODONE HCL 5 MG PO TABS
5.0000 mg | ORAL_TABLET | ORAL | Status: DC | PRN
  Administered 2011-04-20 – 2011-04-21 (×2): 10 mg via ORAL
  Administered 2011-04-23: 5 mg via ORAL
  Filled 2011-04-20: qty 1
  Filled 2011-04-20 (×2): qty 2

## 2011-04-20 MED ORDER — ALUM & MAG HYDROXIDE-SIMETH 400-400-40 MG/5ML PO SUSP
15.0000 mL | ORAL | Status: DC | PRN
  Filled 2011-04-20: qty 30

## 2011-04-20 MED ORDER — SODIUM CHLORIDE 0.9 % IJ SOLN
3.0000 mL | Freq: Two times a day (BID) | INTRAMUSCULAR | Status: DC
  Administered 2011-04-20 – 2011-04-23 (×7): 3 mL via INTRAVENOUS

## 2011-04-20 MED ORDER — ONDANSETRON HCL 4 MG/2ML IJ SOLN: 4.0000 mg | Freq: Four times a day (QID) | INTRAMUSCULAR | Status: DC | PRN

## 2011-04-20 MED ORDER — MAGNESIUM HYDROXIDE 400 MG/5ML PO SUSP: 30.0000 mL | Freq: Every day | ORAL | Status: DC | PRN

## 2011-04-20 MED ORDER — ACETAMINOPHEN 325 MG PO TABS: 650.0000 mg | ORAL_TABLET | Freq: Four times a day (QID) | ORAL | Status: DC | PRN

## 2011-04-20 MED ORDER — PANTOPRAZOLE SODIUM 40 MG PO TBEC: 40.0000 mg | DELAYED_RELEASE_TABLET | Freq: Every day | ORAL | Status: DC

## 2011-04-20 MED ORDER — DOCUSATE SODIUM 100 MG PO CAPS: 200.0000 mg | ORAL_CAPSULE | Freq: Every day | ORAL | Status: DC

## 2011-04-20 MED ORDER — BISACODYL 10 MG RE SUPP: 10.0000 mg | Freq: Every day | RECTAL | Status: DC | PRN

## 2011-04-20 MED ORDER — TRAMADOL HCL 50 MG PO TABS: 50.0000 mg | ORAL_TABLET | ORAL | Status: DC | PRN

## 2011-04-20 MED ORDER — FERROUS SULFATE 325 (65 FE) MG PO TABS
325.0000 mg | ORAL_TABLET | Freq: Three times a day (TID) | ORAL | Status: DC
  Administered 2011-04-20 – 2011-04-23 (×9): 325 mg via ORAL
  Filled 2011-04-20 (×12): qty 1

## 2011-04-20 MED ORDER — BISACODYL 5 MG PO TBEC: 10.0000 mg | DELAYED_RELEASE_TABLET | Freq: Every day | ORAL | Status: DC | PRN

## 2011-04-20 MED ORDER — MOVING RIGHT ALONG BOOK
Freq: Once | Status: AC
  Administered 2011-04-20: 20:00:00
  Filled 2011-04-20: qty 1

## 2011-04-20 MED ORDER — ONDANSETRON HCL 4 MG PO TABS: 4.0000 mg | ORAL_TABLET | Freq: Four times a day (QID) | ORAL | Status: DC | PRN

## 2011-04-20 NOTE — Progress Notes (Signed)
3 Days Post-Op Procedure(s) (LRB): EXCISION MYXOMA (N/A) Subjective:                       [address]            [place] [address]          [phone]     Stronger,walked entire unit but w/ brief episode SVT treated w/ iv lopressor Objective: Vital signs in last 24 hours: Temp:  [98.3 F (36.8 C)-99.7 F (37.6 C)] 98.3 F (36.8 C) (11/11 0800) Pulse Rate:  [71-114] 80  (11/11 1000) Cardiac Rhythm:  [-] Normal sinus rhythm (11/11 1000) Resp:  [14-29] 14  (11/11 1000) BP: (104-151)/(50-73) 123/54 mmHg (11/11 1000) SpO2:  [92 %-100 %] 97 % (11/11 1000) Weight:  [250 lb 10.6 oz (113.7 kg)] 250 lb 10.6 oz (113.7 kg) (11/11 0600)  Hemodynamic parameters for last 24 hours:  stable  Intake/Output from previous day: 11/10 0701 - 11/11 0700 In: 1787.6 [P.O.:880; I.V.:749.6; IV Piggyback:158] Out: 2640 [Urine:2640] Intake/Output this shift: Total I/O In: 670 [P.O.:480; I.V.:40; IV Piggyback:150] Out: 200 [Urine:200]  Lungs clear no murmur,  Mild edema  Lab Results:  Basename 04/20/11 0400 04/19/11 0400  WBC 12.7* 13.4*  HGB 7.2* 7.3*  HCT 21.7* 21.6*  PLT 146* 130*   BMET:  Basename 04/20/11 0400 04/19/11 0400  NA 139 136  K 3.4* 3.9  CL 101 98  CO2 31 30  GLUCOSE 91 148*  BUN 19 14  CREATININE 1.06 1.13*  CALCIUM 9.1 8.8    PT/INR:  Basename 04/20/11 0400  LABPROT 15.6*  INR 1.21   ABG    Component Value Date/Time   PHART 7.390 04/17/2011 1922   HCO3 26.8* 04/17/2011 1922   TCO2 28 04/18/2011 1735   O2SAT 96.0 04/17/2011 1922   CBG (last 3)   Basename 04/20/11 0803 04/20/11 0403 04/20/11 0001  GLUCAP 109* 89 116*    Assessment/Plan: S/P Procedure(s) (LRB): EXCISION MYXOMA (N/A) Treat anemia w/ po FE Transfer to PCTU   LOS: 3 days    VAN TRIGT III,Flossie Wexler 04/20/2011

## 2011-04-20 NOTE — Progress Notes (Signed)
Report called to [name], RN on 2000.  Pt transferred via wheelchair, monitor and left in care of RN in room 2016.  VSS.  Belongings left in room with pt.

## 2011-04-20 NOTE — Progress Notes (Signed)
Subjective:  The patient is doing well.  Has walked in hall.  Rhythm is NSR.  Diabetes satisfactory control on Lantus insulin. This am had short run of atrial fibrillation with RVR while walking, received lopressor 5 mg IV and converted back to NSR after brief junctional bradycardia at time of conversion.  Objective:  Vital Signs in the last 24 hours: Temp:  [98.3 F (36.8 C)-99.7 F (37.6 C)] 98.3 F (36.8 C) (11/11 0800) Pulse Rate:  [71-114] 73  (11/11 0800) Resp:  [15-29] 16  (11/11 0800) BP: (104-151)/(50-73) 112/60 mmHg (11/11 0800) SpO2:  [92 %-100 %] 100 % (11/11 0800) Weight:  [250 lb 10.6 oz (113.7 kg)] 250 lb 10.6 oz (113.7 kg) (11/11 0600)  Intake/Output from previous day: 11/10 0701 - 11/11 0700 In: 1787.6 [P.O.:880; I.V.:749.6; IV Piggyback:158] Out: 2640 [Urine:2640] Intake/Output from this shift: Total I/O In: 150 [IV Piggyback:150] Out: 100 [Urine:100]     . acetaminophen  1,000 mg Oral Q6H   Or  . acetaminophen (TYLENOL) oral liquid 160 mg/5 mL  975 mg Per Tube Q6H  . aspirin EC  325 mg Oral Daily   Or  . aspirin  324 mg Per Tube Daily  . bisacodyl  10 mg Oral Daily   Or  . bisacodyl  10 mg Rectal Daily  . docusate sodium  200 mg Oral Daily  . furosemide  20 mg Intravenous Q6H  . furosemide  20 mg Intravenous Once  . insulin aspart  0-24 Units Subcutaneous Q4H  . insulin glargine  36 Units Subcutaneous BID  . levothyroxine  137 mcg Oral Daily  . lisinopril  20 mg Oral BID  . metoprolol tartrate  25 mg Oral BID   Or  . metoprolol tartrate  25 mg Per Tube BID  . pantoprazole  40 mg Oral Q1200  . potassium chloride  10 mEq Intravenous Q1 Hr x 3  . potassium chloride  10 mEq Intravenous Q1 Hr x 3  . rosuvastatin  10 mg Oral Daily  . sodium chloride  3 mL Intravenous Q12H      . insulin (NOVOLIN-R) infusion    . lactated ringers Stopped (04/20/11 0545)    Physical Exam: The patient appears to be in no distress.  Head and neck exam reveals  that the pupils are equal and reactive.  The extraocular movements are full.  There is no scleral icterus.  Mouth and pharynx are benign.  No lymphadenopathy.  No carotid bruits.  The jugular venous pressure is normal.  Thyroid is not enlarged or tender.  Chest reveals decreased breath sounds both bases.  No wheezing.  Heart reveals no abnormal lift or heave.  First and second heart sounds are normal.  There is no murmur gallop rub or click.  The abdomen is soft and nontender.  Bowel sounds are normoactive.  There is no hepatosplenomegaly or mass.  There are no abdominal bruits.  Extremities reveal no phlebitis or edema.  Pedal pulses are good.  There is no cyanosis or clubbing.  Neurologic exam is normal strength and no lateralizing weakness.  No sensory deficits.  Integument reveals no rash  Lab Results:  Basename 04/20/11 0400 04/19/11 0400  WBC 12.7* 13.4*  HGB 7.2* 7.3*  PLT 146* 130*    Basename 04/20/11 0400 04/19/11 0400  NA 139 136  K 3.4* 3.9  CL 101 98  CO2 31 30  GLUCOSE 91 148*  BUN 19 14  CREATININE 1.06 1.13*   No results  found for this basename: TROPONINI:2,CK,MB:2 in the last 72 hours Hepatic Function Panel  Basename 04/20/11 0400  PROT 6.0  ALBUMIN 2.9*  AST 32  ALT 20  ALKPHOS 48  BILITOT 0.4  BILIDIR --  IBILI --   No results found for this basename: CHOL in the last 72 hours No results found for this basename: PROTIME in the last 72 hours  Imaging: Imaging results have been reviewed.  Slight improvement in CHF pattern.  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  Transient PAF. Post-op anemia. Hypokalemia being corrected.  Plan: Aggressive pulmonary toilet.  Continue current meds.  K repletion per TCTS  LOS: 3 days    Cassell Clement 04/20/2011, 9:29 AM

## 2011-04-21 ENCOUNTER — Encounter: Payer: Medicare Other | Admitting: Thoracic Surgery (Cardiothoracic Vascular Surgery)

## 2011-04-21 ENCOUNTER — Inpatient Hospital Stay (HOSPITAL_COMMUNITY): Payer: Medicare Other

## 2011-04-21 ENCOUNTER — Other Ambulatory Visit: Payer: Self-pay

## 2011-04-21 DIAGNOSIS — I4892 Unspecified atrial flutter: Secondary | ICD-10-CM | POA: Diagnosis not present

## 2011-04-21 DIAGNOSIS — I471 Supraventricular tachycardia: Secondary | ICD-10-CM

## 2011-04-21 LAB — BASIC METABOLIC PANEL
BUN: 19 mg/dL (ref 6–23)
CO2: 28 mEq/L (ref 19–32)
Calcium: 9.5 mg/dL (ref 8.4–10.5)
Chloride: 102 mEq/L (ref 96–112)
Creatinine, Ser: 1.05 mg/dL (ref 0.50–1.10)
GFR calc Af Amer: 61 mL/min — ABNORMAL LOW (ref >90)
GFR calc non Af Amer: 53 mL/min — ABNORMAL LOW (ref >90)
Glucose, Bld: 109 mg/dL — ABNORMAL HIGH (ref 70–99)
Potassium: 3.4 mEq/L — ABNORMAL LOW (ref 3.5–5.1)
Sodium: 140 mEq/L (ref 135–145)

## 2011-04-21 LAB — CBC
HCT: 23 % — ABNORMAL LOW (ref 36.0–46.0)
Hemoglobin: 7.5 g/dL — ABNORMAL LOW (ref 12.0–15.0)
MCH: 27.7 pg (ref 26.0–34.0)
MCHC: 32.6 g/dL (ref 30.0–36.0)
MCV: 84.9 fL (ref 78.0–100.0)
Platelets: 219 10*3/uL (ref 150–400)
RBC: 2.71 MIL/uL — ABNORMAL LOW (ref 3.87–5.11)
RDW: 15 % (ref 11.5–15.5)
WBC: 11.5 10*3/uL — ABNORMAL HIGH (ref 4.0–10.5)

## 2011-04-21 LAB — GLUCOSE, CAPILLARY
Glucose-Capillary: 105 mg/dL — ABNORMAL HIGH (ref 70–99)
Glucose-Capillary: 131 mg/dL — ABNORMAL HIGH (ref 70–99)

## 2011-04-21 MED ORDER — POTASSIUM CHLORIDE CRYS ER 20 MEQ PO TBCR
40.0000 meq | EXTENDED_RELEASE_TABLET | Freq: Three times a day (TID) | ORAL | Status: AC
  Administered 2011-04-21 (×3): 40 meq via ORAL
  Filled 2011-04-21 (×2): qty 2

## 2011-04-21 MED ORDER — HEPARIN (PORCINE) IN NACL 100-0.45 UNIT/ML-% IJ SOLN
1300.0000 [IU]/h | INTRAMUSCULAR | Status: DC
  Administered 2011-04-21: 1300 [IU]/h via INTRAVENOUS
  Filled 2011-04-21 (×2): qty 250

## 2011-04-21 MED ORDER — METOPROLOL TARTRATE 25 MG PO TABS
25.0000 mg | ORAL_TABLET | ORAL | Status: AC
  Administered 2011-04-21: 25 mg via ORAL
  Filled 2011-04-21: qty 1

## 2011-04-21 MED ORDER — METOPROLOL TARTRATE 50 MG PO TABS
50.0000 mg | ORAL_TABLET | Freq: Two times a day (BID) | ORAL | Status: DC
  Filled 2011-04-21 (×2): qty 1

## 2011-04-21 MED ORDER — METOPROLOL TARTRATE 50 MG PO TABS
50.0000 mg | ORAL_TABLET | Freq: Three times a day (TID) | ORAL | Status: DC
Start: 2011-04-21 — End: 2011-04-24
  Administered 2011-04-21 – 2011-04-24 (×8): 50 mg via ORAL
  Filled 2011-04-21 (×10): qty 1

## 2011-04-21 MED FILL — Electrolyte-R (PH 7.4) Solution: INTRAVENOUS | Qty: 1000 | Status: AC

## 2011-04-21 MED FILL — Sodium Chloride Irrigation Soln 0.9%: Qty: 3000 | Status: AC

## 2011-04-21 MED FILL — Heparin Sodium (Porcine) Inj 1000 Unit/ML: INTRAMUSCULAR | Qty: 30 | Status: AC

## 2011-04-21 MED FILL — Sodium Chloride IV Soln 0.9%: INTRAVENOUS | Qty: 1000 | Status: AC

## 2011-04-21 NOTE — Progress Notes (Signed)
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  PTA, PT LIVES ALONE, INDEPENDENT OF ADLS.  SHE REQUESTS SHORT TERM SKILLED NURSING FACILITY PLACEMENT FOR REHAB PRIOR TO RETURNING HOME.  WILL CONSULT CSW TO FACILITATE SNF PLACEMENT WHEN MEDICALLY STABLE FOR DC.

## 2011-04-21 NOTE — Progress Notes (Signed)
TCTS BRIEF PROGRESS NOTE  4 Days Post-Op  S/P Procedure(s) (LRB): EXCISION MYXOMA (N/A)   Erin Haynes remains in A flutter with stable BP   Assessment/Plan:  Will increase metoprolol to 50 tid  Erin Haynes

## 2011-04-21 NOTE — Progress Notes (Signed)
   CARDIOTHORACIC SURGERY PROGRESS NOTE  4 Days Post-Op  S/P Procedure(s) (LRB): EXCISION MYXOMA (N/A)  Subjective: Feels worse today.  Tired and weak.  She went into atrial flutter this morning  Objective: Vital signs in last 24 hours: Temp:  [98.5 F (36.9 C)-99.1 F (37.3 C)] 98.5 F (36.9 C) (11/12 0424) Pulse Rate:  [67-137] 137  (11/12 0802) Cardiac Rhythm:  [-] Normal sinus rhythm (11/12 0805) Resp:  [18-21] 18  (11/12 0424) BP: (115-154)/(52-70) 154/66 mmHg (11/12 0802) SpO2:  [93 %-98 %] 95 % (11/12 0424) Weight:  [113.263 kg (249 lb 11.2 oz)] 249 lb 11.2 oz (113.263 kg) (11/12 1478)  Physical Exam:  Rhythm:   Atrial flutter  Breath sounds: clear  Heart sounds:  RRR  Incisions:  Clean and dry  Abdomen:  soft  Extremities:  warm   Intake/Output from previous day: 11/11 0701 - 11/12 0700 In: 1400 [P.O.:1210; I.V.:40; IV Piggyback:150] Out: 450 [Urine:450] Intake/Output this shift:    Lab Results:  Basename 04/21/11 0658 04/20/11 0400  WBC 11.5* 12.7*  HGB 7.5* 7.2*  HCT 23.0* 21.7*  PLT 219 146*   BMET:  Basename 04/21/11 0658 04/20/11 0400  NA 140 139  K 3.4* 3.4*  CL 102 101  CO2 28 31  GLUCOSE 109* 91  BUN 19 19  CREATININE 1.05 1.06  CALCIUM 9.5 9.1    CBG (last 3)   Basename 04/21/11 0640 04/20/11 2141 04/20/11 1621  GLUCAP 105* 101* 133*    Assessment/Plan: S/P Procedure(s) (LRB): EXCISION MYXOMA (N/A)  New-onset postop atrial flutter. Amiodarone relatively contraindicated because of IV contrast allergy. Will increase Metoprolol and start IV heparin without bolus, in case she may need DCCV. Acute blood loss anemia stable.  OWEN,CLARENCE H

## 2011-04-21 NOTE — Progress Notes (Signed)
ANTICOAGULATION CONSULT NOTE - Follow Up Consult  Pharmacy Consult for Heparin  Indication: atrial fibrillation  Allergies  Allergen Reactions  . Ivp Dye (Iodinated Diagnostic Agents) Anaphylaxis    Throat and face swelling    Patient Measurements: Height: 5\' 6"  (167.6 cm) Weight: 249 lb 11.2 oz (113.263 kg) IBW/kg (Calculated) : 59.3   Vital Signs: Temp: 99.2 F (37.3 C) (11/12 1342) Temp src: Oral (11/12 1342) BP: 112/74 mmHg (11/12 1342) Pulse Rate: 116  (11/12 1342)  Labs:  Basename 04/21/11 2039 04/21/11 0658 04/20/11 0400 04/19/11 0400  HGB -- 7.5* 7.2* --  HCT -- 23.0* 21.7* 21.6*  PLT -- 219 146* 130*  APTT -- -- -- --  LABPROT -- -- 15.6* --  INR -- -- 1.21 --  HEPARINUNFRC <0.10* -- -- --  CREATININE -- 1.05 1.06 1.13*  CKTOTAL -- -- -- --  CKMB -- -- -- --  TROPONINI -- -- -- --   Estimated Creatinine Clearance: 63.7 ml/min (by C-G formula based on Cr of 1.05).     Assessment: Afib post op. Per RN heparin line was out about 1.5hr today and HL drawn only 1 hr after heparin restarted.  HL < goal as expected.  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

## 2011-04-21 NOTE — Progress Notes (Signed)
18CARDIAC REHAB PHASE I   PRE:  Rate/Rhythm: 118 aflutter  BP:  Supine:   Sitting: 138/74  Standing:    SaO2: 97%RA  MODE:  Ambulation: 150 ft   POST:  Rate/Rhythem: 135 aflutter  BP:  Supine:   Sitting: 138/65  Standing:    SaO2: 96%RA  Pt walked 150 ft on RA with rolling walker and asst x 1.  Would have gone farther but HR to 135. Pt with some SOB noted. Tired by end of walk. To recliner with call bell. [phone]  Duanne Limerick

## 2011-04-21 NOTE — Progress Notes (Signed)
CSW completed psychosocial assessment (located in shadow chart). Pt requests SNF placement in [place] and CSW has initiated SNF search. CSW will continue to follow.

## 2011-04-21 NOTE — Progress Notes (Signed)
ANTICOAGULATION CONSULT NOTE - Initial Consult  Pharmacy Consult for heparin  Indication: A-flutter  Allergies  Allergen Reactions  . Ivp Dye (Iodinated Diagnostic Agents) Anaphylaxis    Throat and face swelling    Patient Measurements: Height: 5\' 6"  (167.6 cm) Weight: 249 lb 11.2 oz (113.263 kg) IBW/kg (Calculated) : 59.3  Adjusted Body Weight: 86kg  Vital Signs: Temp: 98.5 F (36.9 C) (11/12 0424) BP: 126/64 mmHg (11/12 1011) Pulse Rate: 110  (11/12 1011)  Labs:  Basename 04/21/11 0658 04/20/11 0400 04/19/11 0400  HGB 7.5* 7.2* --  HCT 23.0* 21.7* 21.6*  PLT 219 146* 130*  APTT -- -- --  LABPROT -- 15.6* --  INR -- 1.21 --  HEPARINUNFRC -- -- --  CREATININE 1.05 1.06 1.13*  CKTOTAL -- -- --  CKMB -- -- --  TROPONINI -- -- --   Estimated Creatinine Clearance: 63.7 ml/min (by C-G formula based on Cr of 1.05).  Medical History: Past Medical History  Diagnosis Date  . Thyroid disease   . Hyperlipidemia   . Diabetes mellitus   . Vertigo   . Osteoarthritis   . Heart palpitations     hx of  . Back pain   . Angina   . Heart murmur     age 9  . Shortness of breath     when walking  . Sleep apnea     last time tested was 2-3 yrs ago at [facility] uses CPAP at times  . Hypertension   . Hyperthyroidism   . Anxiety   . Depression     occasional    Medications:  Scheduled:    . acetaminophen  1,000 mg Oral Q6H   Or  . acetaminophen (TYLENOL) oral liquid 160 mg/5 mL  975 mg Per Tube Q6H  . aspirin EC  325 mg Oral Daily   Or  . aspirin  324 mg Per Tube Daily  . bisacodyl  10 mg Oral Daily   Or  . bisacodyl  10 mg Rectal Daily  . docusate sodium  200 mg Oral Daily  . ferrous sulfate  325 mg Oral TID WC  . furosemide  40 mg Intravenous BID  . insulin aspart  0-24 Units Subcutaneous TID AC & HS  . insulin glargine  36 Units Subcutaneous BID  . levothyroxine  137 mcg Oral Daily  . lisinopril  20 mg Oral BID  . metoprolol tartrate  50 mg Oral BID  .  metoprolol tartrate  25 mg Oral STAT  . moving right along book   Does not apply Once  . pantoprazole  40 mg Oral Q1200  . potassium chloride  40 mEq Oral TID  . povidone-iodine  1 application Topical BID  . rosuvastatin  10 mg Oral Daily  . sodium chloride  3 mL Intravenous Q12H  . DISCONTD: docusate sodium  200 mg Oral Daily  . DISCONTD: insulin aspart  0-24 Units Subcutaneous Q4H  . DISCONTD: metoprolol tartrate  25 mg Per Tube BID  . DISCONTD: metoprolol tartrate  25 mg Oral BID  . DISCONTD: metoprolol tartrate  25 mg Oral BID  . DISCONTD: pantoprazole  40 mg Oral QAC breakfast  . DISCONTD: potassium chloride  20 mEq Oral Once  . DISCONTD: sodium chloride  3 mL Intravenous Q12H    Assessment: 70 YOF s/p OHS for removal of myxoma.  She has developed a-flutter post-operatively.  Orders to start heparin gtt with NO bolus.  Increasing b-blocker dose.   Goal of  Therapy:  Heparin level 0.3-0.7 units/ml   Plan:  1. Start heparin, No bolus, at 1300 units/hr 2. Check 6h heparin level then daily CBC and heparin levels  Arma Reining, Tad Moore 04/21/2011,10:27 AM

## 2011-04-21 NOTE — Progress Notes (Signed)
Subjective:  Patient went into SVT this am at 136/min. ? Atrial flutter vs Sinus tach. No additional sx.  Objective:  Vital Signs in the last 24 hours: Temp:  [98.5 F (36.9 C)-99.1 F (37.3 C)] 98.5 F (36.9 C) (11/12 0424) Pulse Rate:  [67-137] 137  (11/12 0802) Resp:  [14-21] 18  (11/12 0424) BP: (108-154)/(52-70) 154/66 mmHg (11/12 0802) SpO2:  [93 %-98 %] 95 % (11/12 0424) Weight:  [249 lb 11.2 oz (113.263 kg)] 249 lb 11.2 oz (113.263 kg) (11/12 0642)  Intake/Output from previous day: 11/11 0701 - 11/12 0700 In: 1400 [P.O.:1210; I.V.:40; IV Piggyback:150] Out: 450 [Urine:450] Intake/Output from this shift:       . acetaminophen  1,000 mg Oral Q6H   Or  . acetaminophen (TYLENOL) oral liquid 160 mg/5 mL  975 mg Per Tube Q6H  . aspirin EC  325 mg Oral Daily   Or  . aspirin  324 mg Per Tube Daily  . bisacodyl  10 mg Oral Daily   Or  . bisacodyl  10 mg Rectal Daily  . docusate sodium  200 mg Oral Daily  . ferrous sulfate  325 mg Oral TID WC  . furosemide  40 mg Intravenous BID  . insulin aspart  0-24 Units Subcutaneous TID AC & HS  . insulin glargine  36 Units Subcutaneous BID  . levothyroxine  137 mcg Oral Daily  . lisinopril  20 mg Oral BID  . metoprolol tartrate  25 mg Oral BID  . metoprolol tartrate  25 mg Oral STAT  . moving right along book   Does not apply Once  . pantoprazole  40 mg Oral Q1200  . potassium chloride  10 mEq Intravenous Q1 Hr x 3  . potassium chloride  20 mEq Oral Once  . povidone-iodine  1 application Topical BID  . rosuvastatin  10 mg Oral Daily  . sodium chloride  3 mL Intravenous Q12H  . DISCONTD: docusate sodium  200 mg Oral Daily  . DISCONTD: insulin aspart  0-24 Units Subcutaneous Q4H  . DISCONTD: metoprolol tartrate  25 mg Per Tube BID  . DISCONTD: metoprolol tartrate  25 mg Oral BID  . DISCONTD: pantoprazole  40 mg Oral QAC breakfast  . DISCONTD: sodium chloride  3 mL Intravenous Q12H      . insulin (NOVOLIN-R) infusion     . lactated ringers Stopped (04/20/11 1100)    Physical Exam: The patient appears to be in no distress.  Head and neck exam reveals that the pupils are equal and reactive.  The extraocular movements are full.  There is no scleral icterus.  Mouth and pharynx are benign.  No lymphadenopathy.  No carotid bruits.  The jugular venous pressure is normal.  Thyroid is not enlarged or tender.No fresponse to carotid sinus massage.  Chest reveals decreased breath sounds both bases.  No wheezing.  Heart reveals no abnormal lift or heave.  First and second heart sounds are normal.  There is no murmur gallop rub or click.  The abdomen is soft and nontender.  Bowel sounds are normoactive.  There is no hepatosplenomegaly or mass.  There are no abdominal bruits.  Extremities reveal no phlebitis or edema.  Pedal pulses are good.  There is no cyanosis or clubbing.  Neurologic exam is normal strength and no lateralizing weakness.  No sensory deficits.  Integument reveals no rash  Lab Results:  Basename 04/21/11 0658 04/20/11 0400  WBC 11.5* 12.7*  HGB 7.5*  7.2*  PLT 219 146*    Basename 04/21/11 0658 04/20/11 0400  NA 140 139  K 3.4* 3.4*  CL 102 101  CO2 28 31  GLUCOSE 109* 91  BUN 19 19  CREATININE 1.05 1.06   No results found for this basename: TROPONINI:2,CK,MB:2 in the last 72 hours Hepatic Function Panel  Basename 04/20/11 0400  PROT 6.0  ALBUMIN 2.9*  AST 32  ALT 20  ALKPHOS 48  BILITOT 0.4  BILIDIR --  IBILI --   No results found for this basename: CHOL in the last 72 hours No results found for this basename: PROTIME in the last 72 hours  Imaging: Imaging results have been reviewed.    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  Recurrent SVT Post-op anemia. Hypokalemia.  Plan: Aggressive pulmonary toilet.  Continue current meds.  K repletion per TCTS Will give additional Lopressor 25 mg stat    LOS: 4 days    Cassell Clement 04/21/2011, 8:48 AM

## 2011-04-22 LAB — GLUCOSE, CAPILLARY
Glucose-Capillary: 87 mg/dL (ref 70–99)
Glucose-Capillary: 81 mg/dL (ref 70–99)

## 2011-04-22 LAB — CBC
Hemoglobin: 7.5 g/dL — ABNORMAL LOW (ref 12.0–15.0)
MCH: 27.8 pg (ref 26.0–34.0)
MCV: 85.2 fL (ref 78.0–100.0)
Platelets: 305 10*3/uL (ref 150–400)
RBC: 2.7 MIL/uL — ABNORMAL LOW (ref 3.87–5.11)

## 2011-04-22 MED ORDER — HEPARIN (PORCINE) IN NACL 100-0.45 UNIT/ML-% IJ SOLN
1750.0000 [IU]/h | INTRAMUSCULAR | Status: DC
  Administered 2011-04-22: 1600 [IU]/h via INTRAVENOUS
  Administered 2011-04-23: 1750 [IU]/h via INTRAVENOUS
  Filled 2011-04-22 (×2): qty 250

## 2011-04-22 MED FILL — Potassium Chloride Inj 2 mEq/ML: INTRAVENOUS | Qty: 40 | Status: AC

## 2011-04-22 MED FILL — Insulin Regular (Human) Inj 100 Unit/ML: INTRAMUSCULAR | Qty: 10 | Status: AC

## 2011-04-22 MED FILL — Heparin Sodium (Porcine) Inj 5000 Unit/ML: INTRAMUSCULAR | Qty: 1 | Status: AC

## 2011-04-22 MED FILL — Epinephrine HCl Inj 1 MG/ML: INTRAMUSCULAR | Qty: 4 | Status: AC

## 2011-04-22 MED FILL — Papaverine HCl Inj 30 MG/ML: INTRAMUSCULAR | Qty: 2 | Status: AC

## 2011-04-22 MED FILL — Cefuroxime Sodium For Inj 1.5 GM: INTRAMUSCULAR | Qty: 1.5 | Status: AC

## 2011-04-22 MED FILL — Nitroglycerin IV Soln 200 MCG/ML in D5W: INTRAVENOUS | Qty: 250 | Status: AC

## 2011-04-22 MED FILL — Aminocaproic Acid Inj 250 MG/ML: INTRAVENOUS | Qty: 40 | Status: AC

## 2011-04-22 MED FILL — Dexmedetomidine HCl IV Soln 200 MCG/2ML: INTRAVENOUS | Qty: 4 | Status: AC

## 2011-04-22 MED FILL — Vancomycin HCl For IV Soln 1 GM (Base Equivalent): INTRAVENOUS | Qty: 1500 | Status: AC

## 2011-04-22 MED FILL — Dopamine in Dextrose 5% Inj 3.2 MG/ML: INTRAVENOUS | Qty: 250 | Status: AC

## 2011-04-22 MED FILL — Cefuroxime Sodium For Inj 750 MG: INTRAMUSCULAR | Qty: 750 | Status: AC

## 2011-04-22 MED FILL — Magnesium Sulfate Inj 50%: INTRAMUSCULAR | Qty: 2 | Status: AC

## 2011-04-22 MED FILL — Phenylephrine HCl Inj 10 MG/ML: INTRAMUSCULAR | Qty: 1 | Status: AC

## 2011-04-22 NOTE — Progress Notes (Signed)
CARDIAC REHAB PHASE I   PRE:  Rate/Rhythm: 66 SR  BP:  Supine:   Sitting: 93/51  Standing:    SaO2: 96% RA  MODE:  Ambulation: 272 ft   POST:  Rate/Rhythem: 83 SR  BP:  Supine:   Sitting: 129/52  Standing:    SaO2: 98 % RA 1330-1405 Pt. tolerated ambulation fair with assist times one and using walker. She is DOE and tires easily. Pt. admits to not much walking prior to surgery. To bed after walk with call light in reach. Erin Haynes

## 2011-04-22 NOTE — Progress Notes (Signed)
ANTICOAGULATION CONSULT NOTE - Follow Up Consult  Pharmacy Consult for Heparin  Indication: Post-Op A-Flutter   O: Heparin level 0.21, goal 0.3-0.7. Current heparin rate: 1600 units/hr.   Assessment: 70yof s/p OHS for removal of atrial myxoma, now on heparin for post-op a-flutter. Heparin level subtherapeutic   Plan:  1) Increase heparin rate to 1750 units/hr 2) f/u am heparin level   Erin Haynes 04/22/2011,9:26 PM

## 2011-04-22 NOTE — Progress Notes (Signed)
[facility]  SUBJECTIVE:Patient went into atrial flutter last night.  She underwent rapid atrial pacing and is now back in NSR.  She has been walking, some dyspnea with ambulation.    Filed Vitals:   04/21/11 1342 04/21/11 2100 04/22/11 0423 04/22/11 1037  BP: 112/74 129/99 130/79 116/79  Pulse: 116 125 103 82  Temp: 99.2 F (37.3 C) 98.7 F (37.1 C) 97.5 F (36.4 C)   TempSrc: Oral Oral Oral   Resp: 19 20 20    Height:      Weight:   111.766 kg (246 lb 6.4 oz)   SpO2: 95% 97% 97%     Intake/Output Summary (Last 24 hours) at 04/22/11 1346 Last data filed at 04/22/11 0800  Gross per 24 hour  Intake    516 ml  Output    800 ml  Net   -284 ml    LABS: Basic Metabolic Panel:  Basename 04/21/11 0658 04/20/11 0400  NA 140 139  K 3.4* 3.4*  CL 102 101  CO2 28 31  GLUCOSE 109* 91  BUN 19 19  CREATININE 1.05 1.06  CALCIUM 9.5 9.1  MG -- --  PHOS -- --   Liver Function Tests:  Basename 04/20/11 0400  AST 32  ALT 20  ALKPHOS 48  BILITOT 0.4  PROT 6.0  ALBUMIN 2.9*   No results found for this basename: LIPASE:2,AMYLASE:2 in the last 72 hours CBC:  Basename 04/22/11 0905 04/21/11 0658  WBC 10.8* 11.5*  NEUTROABS -- --  HGB 7.5* 7.5*  HCT 23.0* 23.0*  MCV 85.2 84.9  PLT 305 219    RADIOLOGY: Dg Chest 2 View  04/21/2011  *RADIOLOGY REPORT*  Clinical Data: Postop CABG  CHEST - 2 VIEW  Comparison: 04/20/2011; 04/19/2011; 04/18/2011  Findings: Unchanged enlarged cardiac silhouette and mediastinal contours post median sternotomy.  Interval removal of left jugular central venous catheter. The pulmonary vasculature remains indistinct with minimal increase in cephalization of flow.  No change to minimal increase in bilateral effusions and bibasilar opacities.  Grossly unchanged bones including bilateral para lumbar spinal fusion, incompletely imaged.  IMPRESSION:  Mild worsening of pulmonary edema  Original Report Authenticated By: Waynard Reeds, M.D.    PHYSICAL EXAM General: NAD Neck: No JVP 8-9 cm, no thyromegaly or thyroid nodule.  Lungs: Clear to auscultation bilaterally with normal respiratory effort. CV: Nondisplaced PMI.  Heart regular S1/S2, no S3/S4, no murmur. Trace ankle edema bilaterally.  No carotid bruit.   Abdomen: Soft, nontender, no hepatosplenomegaly, no distention.  Neurologic: Alert and oriented x 3.  Psych: Normal affect. Extremities: No clubbing or cyanosis.   TELEMETRY: Reviewed telemetry pt in sinus rhythm now, probable atrial flutter earlier today.  ASSESSMENT AND PLAN:  70 yo now s/p excision of left atrial myxoma.  Post-op atrial flutter, now back in NSR.  Acute on chronic diastolic CHF post-op.   1. Atrial fibrillation: Post-op.  Now back in NSR.  Continue metoprolol. 2. Acute on chronic diastolic CHF: Continue diuresis, Lasix 40 IV bid, follow creatinine/K and strict I/Os.   @ME1 @

## 2011-04-22 NOTE — Progress Notes (Signed)
ANTICOAGULATION CONSULT NOTE - Follow Up Consult  Pharmacy Consult for heparin Indication:  A-flutter  Allergies  Allergen Reactions  . Ivp Dye (Iodinated Diagnostic Agents) Anaphylaxis    Throat and face swelling    Patient Measurements: Height: 5\' 6"  (167.6 cm) Weight: 246 lb 6.4 oz (111.766 kg) IBW/kg (Calculated) : 59.3    Vital Signs: Temp: 97.5 F (36.4 C) (11/13 0423) Temp src: Oral (11/13 0423) BP: 116/79 mmHg (11/13 1037) Pulse Rate: 82  (11/13 1037)  Labs:  Basename 04/22/11 0905 04/21/11 2039 04/21/11 0658 04/20/11 0400  HGB 7.5* -- 7.5* --  HCT 23.0* -- 23.0* 21.7*  PLT 305 -- 219 146*  APTT -- -- -- --  LABPROT -- -- -- 15.6*  INR -- -- -- 1.21  HEPARINUNFRC <0.10* <0.10* -- --  CREATININE -- -- 1.05 1.06  CKTOTAL -- -- -- --  CKMB -- -- -- --  TROPONINI -- -- -- --   Estimated Creatinine Clearance: 63.2 ml/min (by C-G formula based on Cr of 1.05).   Medications:  Scheduled:    . acetaminophen  1,000 mg Oral Q6H   Or  . acetaminophen (TYLENOL) oral liquid 160 mg/5 mL  975 mg Per Tube Q6H  . aspirin EC  325 mg Oral Daily   Or  . aspirin  324 mg Per Tube Daily  . bisacodyl  10 mg Oral Daily   Or  . bisacodyl  10 mg Rectal Daily  . docusate sodium  200 mg Oral Daily  . ferrous sulfate  325 mg Oral TID WC  . furosemide  40 mg Intravenous BID  . insulin aspart  0-24 Units Subcutaneous TID AC & HS  . insulin glargine  36 Units Subcutaneous BID  . levothyroxine  137 mcg Oral Daily  . lisinopril  20 mg Oral BID  . metoprolol tartrate  50 mg Oral TID  . pantoprazole  40 mg Oral Q1200  . potassium chloride  40 mEq Oral TID  . povidone-iodine  1 application Topical BID  . rosuvastatin  10 mg Oral Daily  . sodium chloride  3 mL Intravenous Q12H  . DISCONTD: metoprolol tartrate  50 mg Oral BID    Assessment: 70yof s/p OHS for removal of atrial myxoma. She developed post-op a-flutter, now in NSR following rapid atrial pacing.  Thoracic surgery  notes state to continue heparin for now.  Patient asked lab to come back this am so level drawn late and is undetectable  Goal of Therapy:  Heparin level 0.3-0.7 units/ml   Plan:  1. Increase heparin to 1600 units/hr 2. Check 6 hr heparin level  Erin Haynes, Erin Haynes 04/22/2011,11:51 AM

## 2011-04-22 NOTE — Progress Notes (Addendum)
5 Days Post-Op Procedure(s) (LRB): EXCISION MYXOMA (N/A)  Subjective: Patient eating breakfast without complaints.  Objective: Vital signs in last 24 hours: Patient Vitals for the past 24 hrs:  BP Temp Temp src Pulse Resp SpO2 Weight  04/22/11 0423 130/79 mmHg 97.5 F (36.4 C) Oral 103  20  97 % 246 lb 6.4 oz (111.766 kg)  04/21/11 2100 129/99 mmHg 98.7 F (37.1 C) Oral 125  20  97 % -  04/21/11 1342 112/74 mmHg 99.2 F (37.3 C) Oral 116  19  95 % -  04/21/11 1011 126/64 mmHg - - 110  - - -  04/21/11 1000 124/64 mmHg - - 110  - - -  04/21/11 0802 154/66 mmHg - - 137  - - -   Pre op weight 110.7kg;Today's weight 111.7 kg.     Intake/Output from previous day: 11/12 0701 - 11/13 0700 In: 480 [P.O.:480] Out: 800 [Urine:800]   Physical Exam:  Cardiovascular: RRR, no murmurs, gallops, or rubs. Pulmonary: Clear to auscultation bilaterally; no rales, wheezes, or rhonchi. Abdomen: Soft, non tender, bowel sounds present. Extremities: Mild bilateral lower extremity edema. Wound: Clean and dry.  No erythema or signs of infection.  Lab Results: CBC: Basename 04/21/11 0658 04/20/11 0400  WBC 11.5* 12.7*  HGB 7.5* 7.2*  HCT 23.0* 21.7*  PLT 219 146*   BMET:  Basename 04/21/11 0658 04/20/11 0400  NA 140 139  K 3.4* 3.4*  CL 102 101  CO2 28 31  GLUCOSE 109* 91  BUN 19 19  CREATININE 1.05 1.06  CALCIUM 9.5 9.1    PT/INR:  Basename 04/20/11 0400  LABPROT 15.6*  INR 1.21   ABG:  INR: Will add last result for INR, ABG once components are confirmed Will add last 4 CBG results once components are confirmed  Assessment/Plan:  1. CV - Mostly aflutter with HR in the 100's this am.  Will give Lopressor scheduled for 10 am now. Continue Lisinopril and Heparin gttp.  Dr. Cornelius Moras to attempt to pace back into SR, which was successful.   2.  Pulmonary - Stable. 3. Volume Overload - On lasix 40 mg iv bid.  Will add a potassium supplement and check BMET in am. 4.  Acute blood loss  anemia - last H/H 7.5/23. Continue with ferrous sulfate tid. 5.DM- CBGs 106/189/88.  On insulin 36 units bid. Patient was on 1000 mg in am and 500 mg in pm pre operatively.  Will restart 500 mg bid. 6.Possible discharge on Thursday.   Ardelle Balls, PA 04/22/2011   I have seen and examined the patient and agree with the assessment and plan as outlined.  Patient underwent successful rapid atrial pacing back into NSR at the bedside.  Will continue heparin for now and monitor rhythm.  Continue metoprolol.  OWEN,CLARENCE H

## 2011-04-22 NOTE — Progress Notes (Signed)
Pt has accepted bed offer at [facility]. CSW has contacted facility and will continue to follow for d/c planning when pt is medically cleared.

## 2011-04-23 LAB — BASIC METABOLIC PANEL
BUN: 20 mg/dL (ref 6–23)
Chloride: 98 mEq/L (ref 96–112)
GFR calc Af Amer: 56 mL/min — ABNORMAL LOW (ref >90)
Glucose, Bld: 75 mg/dL (ref 70–99)
Potassium: 3.3 mEq/L — ABNORMAL LOW (ref 3.5–5.1)

## 2011-04-23 LAB — GLUCOSE, CAPILLARY
Glucose-Capillary: 65 mg/dL — ABNORMAL LOW (ref 70–99)
Glucose-Capillary: 78 mg/dL (ref 70–99)

## 2011-04-23 LAB — CBC
MCV: 86.6 fL (ref 78.0–100.0)
Platelets: 333 10*3/uL (ref 150–400)
RBC: 2.54 MIL/uL — ABNORMAL LOW (ref 3.87–5.11)
RDW: 15.2 % (ref 11.5–15.5)
WBC: 11 10*3/uL — ABNORMAL HIGH (ref 4.0–10.5)

## 2011-04-23 LAB — HEPARIN LEVEL (UNFRACTIONATED): Heparin Unfractionated: 0.36 IU/mL (ref 0.30–0.70)

## 2011-04-23 MED ORDER — POTASSIUM CHLORIDE CRYS ER 20 MEQ PO TBCR
40.0000 meq | EXTENDED_RELEASE_TABLET | Freq: Every day | ORAL | Status: DC
  Administered 2011-04-23 – 2011-04-24 (×2): 40 meq via ORAL
  Filled 2011-04-23 (×2): qty 2

## 2011-04-23 MED ORDER — POTASSIUM CHLORIDE CRYS ER 20 MEQ PO TBCR
30.0000 meq | EXTENDED_RELEASE_TABLET | Freq: Once | ORAL | Status: AC
  Administered 2011-04-23: 30 meq via ORAL
  Filled 2011-04-23: qty 1

## 2011-04-23 MED ORDER — FERROUS SULFATE 325 (65 FE) MG PO TABS
325.0000 mg | ORAL_TABLET | Freq: Every day | ORAL | Status: DC
  Administered 2011-04-24: 325 mg via ORAL
  Filled 2011-04-23 (×2): qty 1

## 2011-04-23 MED ORDER — POTASSIUM CHLORIDE CRYS ER 20 MEQ PO TBCR
40.0000 meq | EXTENDED_RELEASE_TABLET | Freq: Once | ORAL | Status: AC
  Administered 2011-04-23: 40 meq via ORAL
  Filled 2011-04-23: qty 2

## 2011-04-23 MED ORDER — METFORMIN HCL 500 MG PO TABS
500.0000 mg | ORAL_TABLET | Freq: Two times a day (BID) | ORAL | Status: DC
  Administered 2011-04-23 – 2011-04-24 (×3): 500 mg via ORAL
  Filled 2011-04-23 (×5): qty 1

## 2011-04-23 MED ORDER — INSULIN ASPART 100 UNIT/ML ~~LOC~~ SOLN: 0.0000 [IU] | Freq: Three times a day (TID) | SUBCUTANEOUS | Status: DC

## 2011-04-23 NOTE — Progress Notes (Signed)
CARDIAC REHAB PHASE I   PRE:  Rate/Rhythm: 76 SR  BP:  Supine: 155/54  Sitting:   Standing:    SaO2: 97 % RA  MODE:  Ambulation: 350 ft   POST:  Rate/Rhythem: 100 SR  BP:  Supine:   Sitting: 157/58  Standing:    SaO2: 98 % RA 1105-1140 Tolerated walk fair. Pt tire easily and is DOE. RA sats stable. Took several standing rest stops. Exhasted by end of walk To recliner with call light in reach.  Beatrix Fetters

## 2011-04-23 NOTE — Progress Notes (Signed)
CSW facillitated communications between pt and [facility] admissions. Pt was upset by an interaction with SNF today. CSW has secured bed for pt at [facility], pt preferred facility and will follow to facilitate transfer tomorrow.

## 2011-04-23 NOTE — Progress Notes (Addendum)
6 Days Post-Op Procedure(s) (LRB): EXCISION MYXOMA (N/A)  Subjective: Patient with loose stools this am and feeling "run down".  Objective: Vital signs in last 24 hours: Patient Vitals for the past 24 hrs:  BP Temp Temp src Pulse Resp SpO2 Height Weight  04/23/11 0452 133/69 mmHg 98.1 F (36.7 C) Oral 62  18  97 % 5\' 6"  (1.676 m) 252 lb 3.3 oz (114.4 kg)  04/22/11 2232 113/68 mmHg 98.5 F (36.9 C) Oral 80  18  95 % - -  04/22/11 1645 123/60 mmHg - - 66  - - - -  04/22/11 1413 93/51 mmHg 97.8 F (36.6 C) Oral 66  19  96 % - -  04/22/11 1037 116/79 mmHg - - 82  - - - -   Pre op weight 110.7kg;Today's weight 114.4 kg (up from 111 kg)     Intake/Output from previous day: 11/13 0701 - 11/14 0700 In: 1206 [P.O.:730; I.V.:476] Out: 3 [Stool:3]   Physical Exam:  Cardiovascular: RRR, no murmurs, gallops, or rubs. Pulmonary: Clear to auscultation bilaterally; no rales, wheezes, or rhonchi. Abdomen: Soft, non tender, bowel sounds present. Extremities: Mild bilateral lower extremity edema. Wound: Clean and dry.  No erythema or signs of infection.  Lab Results: CBC:  Basename 04/23/11 0714 04/22/11 0905  WBC 11.0* 10.8*  HGB 7.1* 7.5*  HCT 22.0* 23.0*  PLT 333 305   BMET:   Basename 04/21/11 0658  NA 140  K 3.4*  CL 102  CO2 28  GLUCOSE 109*  BUN 19  CREATININE 1.05  CALCIUM 9.5    PT/INR: No results found for this basename: LABPROT,INR in the last 72 hours ABG:  INR: Will add last result for INR, ABG once components are confirmed Will add last 4 CBG results once components are confirmed  Assessment/Plan:  1. CV - Mostly aflutter with HR in the 100's 11/13; Maintaining SR since around 11:30 am 11/13 after having had rapid atrial pacing.  Continue with current medications, but will stop heparin gttp. 2.  Pulmonary - Stable. 3. Volume Overload - On lasix 40 mg iv bid. 4.  Acute blood loss anemia - last H/H 7.1/22. Will transfuse 2 units PRBCs. Continue with  ferrous sulfate daily. 5.DM- CBGs 148/91/109. Marland Kitchen Patient was on Metformin 1000 mg in am and 500 mg in pm pre operatively. Will continue Metformin at 500 mg po bid for now as glucose well controlled. 6.Remove EPW about 6 hours after heparin gttp stopped. 7.Stop all stool softeners secondary to diarrhea. 8.Possible discharge in am.   Ardelle Balls, PA 04/23/2011    I have seen and examined the patient and agree with the assessment and plan as outlined.  Kaelin Holford H

## 2011-04-24 LAB — TYPE AND SCREEN
ABO/RH(D): O POS
Antibody Screen: NEGATIVE
Unit division: 0
Unit division: 0

## 2011-04-24 LAB — GLUCOSE, CAPILLARY: Glucose-Capillary: 85 mg/dL (ref 70–99)

## 2011-04-24 MED ORDER — POTASSIUM CHLORIDE CRYS ER 20 MEQ PO TBCR: 20.0000 meq | EXTENDED_RELEASE_TABLET | Freq: Every day | ORAL | Status: DC

## 2011-04-24 MED ORDER — POTASSIUM CHLORIDE CRYS ER 20 MEQ PO TBCR: 20.0000 meq | EXTENDED_RELEASE_TABLET | Freq: Two times a day (BID) | ORAL | Status: DC

## 2011-04-24 MED ORDER — TRAMADOL HCL 50 MG PO TABS: 50.0000 mg | ORAL_TABLET | ORAL | Status: AC | PRN

## 2011-04-24 MED ORDER — FUROSEMIDE 40 MG PO TABS: 40.0000 mg | ORAL_TABLET | Freq: Every day | ORAL | Status: DC

## 2011-04-24 MED ORDER — GUAIFENESIN ER 600 MG PO TB12: 600.0000 mg | ORAL_TABLET | Freq: Two times a day (BID) | ORAL | Status: DC | PRN

## 2011-04-24 MED ORDER — FUROSEMIDE 40 MG PO TABS
40.0000 mg | ORAL_TABLET | Freq: Every day | ORAL | Status: DC
  Filled 2011-04-24: qty 1

## 2011-04-24 MED ORDER — ASPIRIN 325 MG PO TBEC: 325.0000 mg | DELAYED_RELEASE_TABLET | Freq: Every day | ORAL | Status: AC

## 2011-04-24 MED ORDER — METOPROLOL TARTRATE 50 MG PO TABS: 50.0000 mg | ORAL_TABLET | Freq: Three times a day (TID) | ORAL | Status: DC

## 2011-04-24 NOTE — Discharge Summary (Signed)
I agree with the above discharge summary and plan for follow-up.  OWEN,CLARENCE H  

## 2011-04-24 NOTE — Progress Notes (Signed)
Cardiac Rehab [phone] Education completed with pt. Understanding voiced. Encouraged following diabetic diet.Yarelly Kuba DunlapRn

## 2011-04-24 NOTE — Discharge Summary (Signed)
ANGELAMARIE AVAKIAN March 12, 1941 70 y.o. [id number]  04/17/2011   Purcell Nails, MD  Left artrial myxoma   HPI:  This is a 70 y.o. female with a history of hypertension, type 2 diabetes mellitus, and hyperlipidemia. She has had multiple previous surgical procedures in the past for a variety of orthopedic problems. Because of her associated risk factors, she was evaluated many years ago for the possible presence of coronary artery disease and she underwent cardiac catheterization demonstrating mild nonobstructive coronary disease in 2002. She also has a history of a heart murmur which was originally documented at age 15. She has been followed for many years by Dr. Patty Sermons. She has however, not been seen since 2008 by him. Over the last year and a half or so the patient developed progressive sensation of generalized fatigue as well as worsening exertional shortness of breath. She has atypical chest pain and occasional palpitations. She was seen recently by Dr. Patty Sermons and a stress test was performed which was felt to be low risk for ischemic heart disease. She also had a 2-D echocardiogram performed on 03/26/2011. This confirmed the presence of normal ventricular size and function but there was a mass noted in the left atrium suggestive of possible atrial myxoma. The patient subsequently underwent transesophageal echocardiogram on 04/02/2011 confirming the presence of a bilobed mass attached to the intra-atrial septum consistent with a left atrial myxoma. No other significant abnormalities were noted. The patient was referred to Dr. Tressie Stalker for surgical intervention. She was admitted this hospitalization for the procedure.  Past surgical history: 1. Bilateral knee arthroplasty 2. Hysterectomy for fibroid tumors  Family history /social history/review of symptoms/physical exam: See history and physical done at the time of admission.        Hospital Course:  The patient was admitted to the  hospital and taken to the operating room on 04/17/2011 and underwent Procedure(s): EXCISION MYXOMA.  The pt tolerated the procedure well and was transported to the PACU in good condition.   Postoperative hospital course:  The patient has overall progressed nicely. Her diabetes has been under good control using standard protocols. She has had postoperative flutter but has subsequently been returned to sinus rhythm. She does have a moderate volume overload but is responding well to diuretics which will continue at least a short-term period she has an acute blood loss anemia. She was last transfused on 04/23/2011. She received 2 units at that time. This was for a hemoglobin and hematocrit of 7.1 and 22.0 respectively. All routine lines, monitors, tubes have been discontinued in the standard fashion. She is hemodynamically stable on current medical regimen. Her incisions are healing well without infection. She is tolerating gradual increase in activities using standard protocols. It is felt that she would benefit from short-term skilled nursing facility stay to continue her rehabilitation. These arrangements have been finalized and she is felt to be stable for discharge to a skilled facility on today's date.     Basename 04/23/11 1055  NA 138  K 3.3*  CL 98  CO2 29  GLUCOSE 75  BUN 20  CALCIUM 9.9    Basename 04/23/11 0714 04/22/11 0905  WBC 11.0* 10.8*  HGB 7.1* 7.5*  HCT 22.0* 23.0*  PLT 333 305   No results found for this basename: INR:2 in the last 72 hours   Discharge Instructions:  The patient is discharged to home with extensive instructions on wound care and progressive ambulation.  They are instructed not to drive  or perform any heavy lifting until returning to see the physician in his office.  Discharge Diagnosis:  Left artrial myxoma  Secondary Diagnosis: Patient Active Problem List  Diagnoses  . Vertigo  . Coronary artery disease  . Osteoarthritis  . Heart palpitations    . Heart murmur  . Back pain  . Dyslipidemia  . Diabetes mellitus  . 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 Myxoma of heart  . Atrial myxoma  . Atrial flutter   Past Medical History  Diagnosis Date  . Thyroid disease   . Hyperlipidemia   . Diabetes mellitus   . Vertigo   . Osteoarthritis   . Heart palpitations     hx of  . Back pain   . Angina   . Heart murmur     age 26  . Shortness of breath     when walking  . Sleep apnea     last time tested was 2-3 yrs ago at [facility] uses CPAP at times  . Hypertension   . Hyperthyroidism   . Anxiety   . Depression     occasional       Elleen, Coulibaly  Home Medication Instructions VOZ:[id number]   Printed on:04/24/11 1346  Medication Information                    levothyroxine (SYNTHROID, LEVOTHROID) 137 MCG tablet Take 137 mcg by mouth daily.            lisinopril (PRINIVIL,ZESTRIL) 20 MG tablet Take 20 mg by mouth 2 (two) times daily.            metFORMIN (GLUCOPHAGE) 500 MG tablet Take 500 mg by mouth daily. Takes 2 tablets in the morning and 1 tablet at night.           Coenzyme Q10 (CO Q 10 PO) Take 1 capsule by mouth daily.            Omega-3 Fatty Acids (FISH OIL) 1200 MG CAPS Take 1 capsule by mouth daily.            solifenacin (VESICARE) 5 MG tablet Take 5 mg by mouth daily as needed. For travel.           EVENING PRIMROSE OIL PO Take 500 mg by mouth daily.            zolpidem (AMBIEN) 10 MG tablet Take 5 mg by mouth at bedtime as needed. For sleep.           meclizine (ANTIVERT) 25 MG tablet Take 25 mg by mouth 3 (three) times daily as needed. For nausea and vomiting.           ST JOHNS WORT PO Take 300 mg by mouth 2 (two) times daily.            Ginkgo Biloba (GINKOBA PO) Take 2-4 capsules by mouth 2 (two) times daily. Pt takes more if she gets up earlier in the morning.           calcium-vitamin D (OSCAL WITH D) 500-200 MG-UNIT per tablet Take 2 tablets by mouth daily.  Chewable            Multiple Vitamins-Minerals (MULTIVITAL) CHEW Chew 2-3 tablets by mouth daily.             rosuvastatin (CRESTOR) 10 MG tablet Take 10 mg by mouth daily.             furosemide (LASIX) 40  MG tablet Take 1 tablet (40 mg total) by mouth daily.           guaiFENesin (MUCINEX) 600 MG 12 hr tablet Take 1 tablet (600 mg total) by mouth every 12 (twelve) hours as needed for congestion.           metoprolol (LOPRESSOR) 50 MG tablet Take 1 tablet (50 mg total) by mouth 3 (three) times daily.           traMADol (ULTRAM) 50 MG tablet Take 1-2 tablets (50-100 mg total) by mouth every 4 (four) hours as needed for pain. Maximum dose= 8 tablets per day           aspirin EC 325 MG EC tablet Take 1 tablet (325 mg total) by mouth daily.           potassium chloride SA (K-DUR,KLOR-CON) 20 MEQ tablet Take 1 tablet (20 mEq total) by mouth 2 (two) times daily.             Disposition: stable for transfer to SNF  Patient's condition is GOOD  Gershon Crane, PA-C 04/24/2011  1:46 PM

## 2011-04-24 NOTE — Progress Notes (Signed)
SUBJECTIVE:  Slight cough.  Continued substernal chest pain.   PHYSICAL EXAM Filed Vitals:   04/23/11 1707 04/23/11 1745 04/23/11 2100 04/24/11 0512  BP:  142/62 134/62 126/63  Pulse: 72 74 72 64  Temp:  99.2 F (37.3 C) 98.2 F (36.8 C) 97.9 F (36.6 C)  TempSrc:  Oral Oral Oral  Resp:  18 20 20   Height:      Weight:    115.214 kg (254 lb)  SpO2:   100% 100%   General:  No distress Lungs:  Few coarse crackles clear with coughing Heart:  RRR, no rub Abdomen:  Obese, postive BS. Extremities:  Right thigh hematoma.  Trace edema  LABS: No results found for this basename: CKTOTAL, CKMB, CKMBINDEX, TROPONINI   Results for orders placed during the hospital encounter of 04/17/11 (from the past 24 hour(s))  TSH     Status: Abnormal   Collection Time   04/23/11  9:07 AM      Component Value Range   TSH 6.053 (*) 0.350 - 4.500 (uIU/mL)  BASIC METABOLIC PANEL     Status: Abnormal   Collection Time   04/23/11 10:55 AM      Component Value Range   Sodium 138  135 - 145 (mEq/L)   Potassium 3.3 (*) 3.5 - 5.1 (mEq/L)   Chloride 98  96 - 112 (mEq/L)   CO2 29  19 - 32 (mEq/L)   Glucose, Bld 75  70 - 99 (mg/dL)   BUN 20  6 - 23 (mg/dL)   Creatinine, Ser 9.60 (*) 0.50 - 1.10 (mg/dL)   Calcium 9.9  8.4 - 45.4 (mg/dL)   GFR calc non Af Amer 49 (*) >90 (mL/min)   GFR calc Af Amer 56 (*) >90 (mL/min)  GLUCOSE, CAPILLARY     Status: Normal   Collection Time   04/23/11 11:58 AM      Component Value Range   Glucose-Capillary 92  70 - 99 (mg/dL)  PREPARE RBC (CROSSMATCH)     Status: Normal   Collection Time   04/23/11 12:00 PM      Component Value Range   Order Confirmation ORDER PROCESSED BY BLOOD BANK    GLUCOSE, CAPILLARY     Status: Normal   Collection Time   04/23/11  4:10 PM      Component Value Range   Glucose-Capillary 78  70 - 99 (mg/dL)   Comment 1 Notify RN    GLUCOSE, CAPILLARY     Status: Normal   Collection Time   04/23/11  9:03 PM      Component Value Range   Glucose-Capillary 93  70 - 99 (mg/dL)  GLUCOSE, CAPILLARY     Status: Normal   Collection Time   04/24/11  5:42 AM      Component Value Range   Glucose-Capillary 85  70 - 99 (mg/dL)    Intake/Output Summary (Last 24 hours) at 04/24/11 0752 Last data filed at 04/24/11 0500  Gross per 24 hour  Intake 1338.75 ml  Output      0 ml  Net 1338.75 ml    Telemtery 04/24/2011:  NSR rate 70s.  ASSESSMENT AND PLAN:  Active Problems:  Myxoma of heart:  Status post resection.  No nursing home today.  Atrial flutter:  Maintaining NSR.  Tolerating current medications including metoprolol 50 tid.    Follow up:  Our office will schedule follow up with Dr. Patty Sermons.    Erin Haynes [facility] 04/24/2011 7:52 AM

## 2011-04-24 NOTE — Progress Notes (Addendum)
7 Days Post-Op Procedure(s) (LRB): EXCISION MYXOMA (N/A)  Subjective: Patient without complaints this am.  She finally had a good night's sleep and has not had any loose stools.  Objective: Vital signs in last 24 hours: Patient Vitals for the past 24 hrs:  BP Temp Temp src Pulse Resp SpO2 Weight  04/24/11 0512 126/63 mmHg 97.9 F (36.6 C) Oral 64  20  100 % 254 lb (115.214 kg)  04/23/11 2100 134/62 mmHg 98.2 F (36.8 C) Oral 72  20  100 % -  04/23/11 1745 142/62 mmHg 99.2 F (37.3 C) Oral 74  18  - -  04/23/11 1707 - - - 72  - - -  04/23/11 1645 130/54 mmHg 99.2 F (37.3 C) Oral 68  18  - -  04/23/11 1545 127/66 mmHg 99.2 F (37.3 C) Oral 68  18  - -  04/23/11 1515 121/63 mmHg 99.2 F (37.3 C) Oral 70  18  - -  04/23/11 1500 120/60 mmHg 99.2 F (37.3 C) Oral 70  18  - -  04/23/11 1415 112/62 mmHg 98.9 F (37.2 C) Oral 68  18  - -  04/23/11 1404 112/62 mmHg 98.9 F (37.2 C) Oral 68  19  97 % -  04/23/11 1315 112/62 mmHg 97.1 F (36.2 C) Oral 69  18  - -  04/23/11 1215 116/49 mmHg 97.7 F (36.5 C) Oral 70  18  - -  04/23/11 1145 157/58 mmHg 98.3 F (36.8 C) Oral 69  18  - -  04/23/11 1025 155/54 mmHg - - 80  - - -   Pre op weight 110.7kg;Today's weight 115.2 kg (up from 114.4 kg)     Intake/Output from previous day: 11/14 0701 - 11/15 0700 In: 1338.8 [P.O.:720; Blood:618.8] Out: 0    Physical Exam:  Cardiovascular: RRR, no murmurs, gallops, or rubs. Pulmonary: Clear to auscultation bilaterally; no rales, wheezes, or rhonchi. Abdomen: Soft, non tender, bowel sounds present. Extremities: Mild bilateral lower extremity edema. Wounds: Clean and dry.  No erythema or signs of infection.  Lab Results: CBC:  Basename 04/23/11 0714 04/22/11 0905  WBC 11.0* 10.8*  HGB 7.1* 7.5*  HCT 22.0* 23.0*  PLT 333 305   BMET:   Basename 04/23/11 1055  NA 138  K 3.3*  CL 98  CO2 29  GLUCOSE 75  BUN 20  CREATININE 1.12*  CALCIUM 9.9     Please note patient's  potassium was found to be 3.3 11/14 and was supplemented with additional KCl 70 meq.   PT/INR: No results found for this basename: LABPROT,INR in the last 72 hours ABG:  INR: Will add last result for INR, ABG once components are confirmed Will add last 4 CBG results once components are confirmed  Assessment/Plan:  1. CV - Maintaining SR 70's;  Continue with current medications as SBP well controlled. 2.  Pulmonary - Stable. 3. Volume Overload - Will discharge home on Lasix 40 mg daily. 4.  Acute blood loss anemia - last H/H 7.1/22. . Transfused with 2 units PRBCs 04/23/2011. 5.DM- CBGs 92/78/93/85. Marland Kitchen Patient was on Metformin 1000 mg in am and 500 mg in pm pre operatively. Will continue Metformin at 500 mg po bid for today as glucose well controlled.  Will resume pre op doses at discharge. 6.Remove chest tube sutures. 7.TSH found to be elevated at 6.053.  Will need to follow up with her medical doctor to have her Synthroid increased. 8.D/C to SNF today.  Ardelle Balls, PA 04/24/2011    I have seen and examined the patient and agree with the assessment and plan as outlined.  Jeaneen Cala H

## 2011-05-07 ENCOUNTER — Encounter (HOSPITAL_COMMUNITY): Payer: Self-pay | Admitting: Thoracic Surgery (Cardiothoracic Vascular Surgery)

## 2011-05-08 ENCOUNTER — Encounter: Payer: Self-pay | Admitting: Nurse Practitioner

## 2011-05-08 ENCOUNTER — Ambulatory Visit (INDEPENDENT_AMBULATORY_CARE_PROVIDER_SITE_OTHER): Payer: Medicare Other | Admitting: Nurse Practitioner

## 2011-05-08 VITALS — BP 148/82 | HR 90 | Ht 66.0 in | Wt 237.8 lb

## 2011-05-08 DIAGNOSIS — D151 Benign neoplasm of heart: Secondary | ICD-10-CM

## 2011-05-08 LAB — CBC WITH DIFFERENTIAL/PLATELET
Basophils Absolute: 0 10*3/uL (ref 0.0–0.1)
Basophils Relative: 0.5 % (ref 0.0–3.0)
Eosinophils Absolute: 0.4 10*3/uL (ref 0.0–0.7)
Eosinophils Relative: 5.8 % — ABNORMAL HIGH (ref 0.0–5.0)
HCT: 35.1 % — ABNORMAL LOW (ref 36.0–46.0)
Hemoglobin: 11.6 g/dL — ABNORMAL LOW (ref 12.0–15.0)
Lymphocytes Relative: 27.8 % (ref 12.0–46.0)
Lymphs Abs: 1.9 10*3/uL (ref 0.7–4.0)
MCHC: 33.1 g/dL (ref 30.0–36.0)
MCV: 87.4 fl (ref 78.0–100.0)
Monocytes Absolute: 0.6 10*3/uL (ref 0.1–1.0)
Monocytes Relative: 8.6 % (ref 3.0–12.0)
Neutro Abs: 3.9 10*3/uL (ref 1.4–7.7)
Neutrophils Relative %: 57.3 % (ref 43.0–77.0)
Platelets: 378 10*3/uL (ref 150.0–400.0)
RBC: 4.02 Mil/uL (ref 3.87–5.11)
RDW: 15.1 % — ABNORMAL HIGH (ref 11.5–14.6)
WBC: 6.8 10*3/uL (ref 4.5–10.5)

## 2011-05-08 LAB — BASIC METABOLIC PANEL
BUN: 19 mg/dL (ref 6–23)
CO2: 30 mEq/L (ref 19–32)
Calcium: 10.1 mg/dL (ref 8.4–10.5)
Chloride: 100 mEq/L (ref 96–112)
Creatinine, Ser: 1.2 mg/dL (ref 0.4–1.2)
GFR: 57.56 mL/min — ABNORMAL LOW (ref >60.00)
Glucose, Bld: 101 mg/dL — ABNORMAL HIGH (ref 70–99)
Potassium: 4 mEq/L (ref 3.5–5.1)
Sodium: 139 mEq/L (ref 135–145)

## 2011-05-08 NOTE — Assessment & Plan Note (Signed)
She is s/p excision of her myxoma. She appears to be doing well. We have reviewed her lifting restrictions. She will not drive until no longer taking narcotics. She is back on all of her routine medicines. We will check follow up CBC and BMET today. I will have her see Dr. Patty Sermons in a month. Need to consider referral to cardiac rehab on return. She currently still has home health in place. Patient is agreeable to this plan and will call if any problems develop in the interim.

## 2011-05-08 NOTE — Progress Notes (Signed)
Erin Haynes Date of Birth: [birth year] Medical Record #[id number]  History of Present Illness: Erin Haynes is seen back today for a post hospital visit. She is seen for Dr. Patty Sermons. She underwent excision of an atrial myxoma earlier this month per Dr. Cornelius Moras. Erin Haynes course was uncomplicated. She did require a short stay at a rehab facility due to living alone. She has been back at Erin Haynes house for the past week. She is doing well. Not using much pain medicine. Home health has been visiting. No chest pain. Has usual post op soreness. Blood pressure at home has been ok as well as Erin Haynes sugars. She is back on all of Erin Haynes routine medicines.   Current Outpatient Prescriptions on File Prior to Visit  Medication Sig Dispense Refill  . calcium-vitamin D (OSCAL WITH D) 500-200 MG-UNIT per tablet Take 2 tablets by mouth daily. Chewable       . Coenzyme Q10 (CO Q 10 PO) Take 1 capsule by mouth daily.       Marland Kitchen EVENING PRIMROSE OIL PO Take 500 mg by mouth daily.       . Ginkgo Biloba (GINKOBA PO) Take 2-4 capsules by mouth 2 (two) times daily. Pt takes more if she gets up earlier in the morning.      Marland Kitchen guaiFENesin (MUCINEX) 600 MG 12 hr tablet Take 1 tablet (600 mg total) by mouth every 12 (twelve) hours as needed for congestion.    0  . levothyroxine (SYNTHROID, LEVOTHROID) 137 MCG tablet Take 137 mcg by mouth daily.       Marland Kitchen lisinopril (PRINIVIL,ZESTRIL) 20 MG tablet Take 20 mg by mouth 2 (two) times daily.       . meclizine (ANTIVERT) 25 MG tablet Take 25 mg by mouth 3 (three) times daily as needed. For nausea and vomiting.      . metFORMIN (GLUCOPHAGE) 500 MG tablet Take 500 mg by mouth daily. Takes 2 tablets in the morning and 1 tablet at night.      . Multiple Vitamins-Minerals (MULTIVITAL) CHEW Chew 2-3 tablets by mouth daily.        . Omega-3 Fatty Acids (FISH OIL) 1200 MG CAPS Take 1 capsule by mouth daily.       . potassium chloride SA (K-DUR,KLOR-CON) 20 MEQ tablet Take 1 tablet (20 mEq total) by mouth 2  (two) times daily.      . rosuvastatin (CRESTOR) 10 MG tablet Take 10 mg by mouth daily.        . solifenacin (VESICARE) 5 MG tablet Take 5 mg by mouth daily as needed. For travel.      . ST JOHNS WORT PO Take 300 mg by mouth 2 (two) times daily.       Marland Kitchen zolpidem (AMBIEN) 10 MG tablet Take 5 mg by mouth at bedtime as needed. For sleep.      Marland Kitchen DISCONTD: metoprolol (LOPRESSOR) 50 MG tablet Take 1 tablet (50 mg total) by mouth 3 (three) times daily.        Allergies  Allergen Reactions  . Ivp Dye (Iodinated Diagnostic Agents) Anaphylaxis    Throat and face swelling    Past Medical History  Diagnosis Date  . Thyroid disease   . Hyperlipidemia   . Diabetes mellitus   . Vertigo   . Osteoarthritis   . Heart palpitations     hx of  . Back pain   . Angina   . Heart murmur     age 70  . Shortness  of breath     when walking  . Sleep apnea     last time tested was 2-3 yrs ago at [facility] uses CPAP at times  . Hypertension   . Hyperthyroidism   . Anxiety   . Depression     occasional  . Atrial myxoma     s/p excision in November 2012; Dr. Cornelius Moras.    Past Surgical History  Procedure Date  . Total knee arthroplasty     right and left  . Other surgical history     hysterectomy,fibroid tumors  . Back surgery 2006    ddd, bone spurs  . Back surgery   . Back surgery 2006    ddd, bone spurs  . Cardiac catheterization 2002, 04/15/2011    minimal nonobstructive CAD, TEE and stress test recent goes to Dr. Patty Sermons  . Excision myxoma 04/17/2011    Procedure: EXCISION MYXOMA;  Surgeon: Purcell Nails, MD;  Location: [facility] OR;  Service: Open Heart Surgery;  Laterality: N/A;  Excision of Left Atrial Myxoma.    History  Smoking status  . Former Smoker  . Quit date: 06/10/1990  Smokeless tobacco  . Never Used    History  Alcohol Use No    Family History  Problem Relation Age of Onset  . Heart attack Mother     age 59  . Stroke Mother   . Heart attack Father     Review of  Systems: The review of systems is per the HPI. She is using a walker to ambulate. Not short of breath. No swelling. Bowels are working ok. Appetite is still off some. Wounds are healing nicely. No fever or chills.  All other systems were reviewed and are negative.  Physical Exam: BP 148/82  Pulse 90  Ht 5\' 6"  (1.676 m)  Wt 237 lb 12.8 oz (107.865 kg)  BMI 38.38 kg/m2 Patient is very pleasant and in no acute distress. She is obese. Skin is warm and dry. Color is normal.  HEENT is unremarkable. Normocephalic/atraumatic. PERRL. Sclera are nonicteric. Neck is supple. No masses. No JVD. Lungs are clear. Cardiac exam shows a regular rate and rhythm. Sternum looks good. Abdomen is obese but soft. Extremities are without edema. Gait and ROM are intact. She is using a walker. No gross neurologic deficits noted.  LABORATORY DATA: EKG shows sinus rhythm.  Assessment / Plan:

## 2011-05-08 NOTE — Patient Instructions (Addendum)
I think you are doing well.  Stay on your current medicines.  We are going to check some labs.  I will have you see Dr. Patty Sermons in about 3 weeks.  Walking some every day is recommended.  Call the [facility] office at [phone] if you have any questions, problems or concerns.

## 2011-05-12 ENCOUNTER — Other Ambulatory Visit: Payer: Self-pay | Admitting: Thoracic Surgery (Cardiothoracic Vascular Surgery)

## 2011-05-12 DIAGNOSIS — D487 Neoplasm of uncertain behavior of other specified sites: Secondary | ICD-10-CM

## 2011-05-19 ENCOUNTER — Ambulatory Visit (INDEPENDENT_AMBULATORY_CARE_PROVIDER_SITE_OTHER): Payer: Self-pay | Admitting: Thoracic Surgery (Cardiothoracic Vascular Surgery)

## 2011-05-19 ENCOUNTER — Ambulatory Visit
Admission: RE | Admit: 2011-05-19 | Discharge: 2011-05-19 | Disposition: A | Discharge status: Home / Self Care | Payer: Medicare Other | Source: Ambulatory Visit | Attending: Thoracic Surgery (Cardiothoracic Vascular Surgery) | Admitting: Thoracic Surgery (Cardiothoracic Vascular Surgery)

## 2011-05-19 ENCOUNTER — Encounter: Payer: Self-pay | Admitting: Thoracic Surgery (Cardiothoracic Vascular Surgery)

## 2011-05-19 VITALS — BP 136/80 | HR 96 | Resp 20 | Ht 66.0 in | Wt 242.0 lb

## 2011-05-19 DIAGNOSIS — Z9889 Other specified postprocedural states: Secondary | ICD-10-CM

## 2011-05-19 DIAGNOSIS — D219 Benign neoplasm of connective and other soft tissue, unspecified: Secondary | ICD-10-CM

## 2011-05-19 NOTE — Patient Instructions (Addendum)
The patient has been reminded to continue to avoid any heavy lifting or strenuous use of arms or shoulders for at least a total of three months from the time of surgery. She has been advised to enroll and participate in the cardiac rehab program.

## 2011-05-19 NOTE — Progress Notes (Signed)
[address]            [place] [address]          [phone]     CARDIOTHORACIC SURGERY OFFICE NOTE  Referring Provider is Cassell Clement, MD PCP is Katy Apo, MD   HPI:  Patient returns for follow-up status post resection of left atrial myxoma on 04/17/2011. Her postoperative recovery early on was notable for some transient atrial flutter which converted back to sinus rhythm by rapid atrial pacing. Her postoperative recovery was otherwise uneventful and she was discharged initially to a skilled nursing facility. She has done very well since hospital discharge. She has now returned home and she is already return to driving an automobile. She still has physical therapy working with her at home. Her exercise tolerance is slowly improving. She still has some soreness in her right chest and her right thigh related to the surgery, but this is all improving. She is eating well and sleeping well. Overall she has no complaints.   Current Outpatient Prescriptions  Medication Sig Dispense Refill  . calcium-vitamin D (OSCAL WITH D) 500-200 MG-UNIT per tablet Take 2 tablets by mouth daily. Chewable       . Coenzyme Q10 (CO Q 10 PO) Take 1 capsule by mouth daily.       Marland Kitchen EVENING PRIMROSE OIL PO Take 500 mg by mouth daily.       . Ginkgo Biloba (GINKOBA PO) Take 2-4 capsules by mouth 2 (two) times daily. Pt takes more if she gets up earlier in the morning.      Marland Kitchen guaiFENesin (MUCINEX) 600 MG 12 hr tablet Take 1 tablet (600 mg total) by mouth every 12 (twelve) hours as needed for congestion.    0  . levothyroxine (SYNTHROID, LEVOTHROID) 137 MCG tablet Take 137 mcg by mouth daily.       Marland Kitchen lisinopril (PRINIVIL,ZESTRIL) 20 MG tablet Take 20 mg by mouth 2 (two) times daily.       . meclizine (ANTIVERT) 25 MG tablet Take 25 mg by mouth 3 (three) times daily as needed. For nausea and vomiting.      . metFORMIN (GLUCOPHAGE) 500 MG tablet Take 500 mg by mouth daily.  Takes 2 tablets in the morning and 1 tablet at night.      . metoprolol (LOPRESSOR) 50 MG tablet Take 50 mg by mouth 2 (two) times daily.        . Multiple Vitamins-Minerals (MULTIVITAL) CHEW Chew 2-3 tablets by mouth daily.        . Omega-3 Fatty Acids (FISH OIL) 1200 MG CAPS Take 1 capsule by mouth daily.       . potassium chloride SA (K-DUR,KLOR-CON) 20 MEQ tablet Take 1 tablet (20 mEq total) by mouth 2 (two) times daily.      . rosuvastatin (CRESTOR) 10 MG tablet Take 10 mg by mouth daily.        . solifenacin (VESICARE) 5 MG tablet Take 5 mg by mouth daily as needed. For travel.      . ST JOHNS WORT PO Take 300 mg by mouth 2 (two) times daily.       Marland Kitchen zolpidem (AMBIEN) 10 MG tablet Take 5 mg by mouth at bedtime as needed. For sleep.      Marland Kitchen DISCONTD: metoprolol (LOPRESSOR) 50 MG tablet Take 1 tablet (50 mg total) by mouth 3 (  three) times daily.          Physical Exam:   BP 136/80  Pulse 96  Resp 20  Ht 5\' 6"  (1.676 m)  Wt 242 lb (109.77 kg)  BMI 39.06 kg/m2  SpO2 97%  HEENT:  Unremarkable  Chest:   Clear to auscultation and symmetrical bilaterally. Her surgical incision is healing very nicely and the sternum is stable on palpation  CV:   Regular rate and rhythm with no murmur  Abdomen:  Soft and nontender  Extremities:  Warm and well-perfused   Diagnostic Tests:  Chest x-ray performed today demonstrates clear lung fields bilaterally. All the sternal wires appear intact. There are no significant pleural effusions   Impression:  Excellent progress following resection of left atrial myxoma   Plan:  I've encouraged patient to continue to gradually increase her physical activity as tolerated although I have reminded her to refrain from any heavy lifting or strenuous use of her arms and shoulders for at least another 2 months. I've encouraged her to get involved in the cardiac rehabilitation program.  We will see her back in 3 months.   Salvatore Decent. Cornelius Moras, MD 05/19/2011 1:50  PM

## 2011-05-21 ENCOUNTER — Encounter: Payer: Self-pay | Admitting: *Deleted

## 2011-06-12 ENCOUNTER — Ambulatory Visit (INDEPENDENT_AMBULATORY_CARE_PROVIDER_SITE_OTHER): Payer: BC Managed Care – PPO | Admitting: Cardiology

## 2011-06-12 ENCOUNTER — Encounter: Payer: Self-pay | Admitting: Cardiology

## 2011-06-12 VITALS — BP 120/80 | HR 86 | Ht 66.0 in | Wt 236.0 lb

## 2011-06-12 DIAGNOSIS — D151 Benign neoplasm of heart: Secondary | ICD-10-CM

## 2011-06-12 DIAGNOSIS — I4892 Unspecified atrial flutter: Secondary | ICD-10-CM

## 2011-06-12 DIAGNOSIS — I2510 Atherosclerotic heart disease of native coronary artery without angina pectoris: Secondary | ICD-10-CM

## 2011-06-12 DIAGNOSIS — R059 Cough, unspecified: Secondary | ICD-10-CM

## 2011-06-12 DIAGNOSIS — I119 Hypertensive heart disease without heart failure: Secondary | ICD-10-CM

## 2011-06-12 DIAGNOSIS — R05 Cough: Secondary | ICD-10-CM

## 2011-06-12 NOTE — Assessment & Plan Note (Signed)
The patient remains in normal sinus rhythm confirmed by EKG today.  She's had no awareness of any postoperative atrial flutter fibrillation recurrence

## 2011-06-12 NOTE — Assessment & Plan Note (Signed)
The patient's blood pressure has been stable.  She is back taking her spironolactone 25 mg daily which she had previously been on.  Because of her dry nonproductive cough we will drop her dose of lisinopril from 40 mg a day down to 20 mg a day and observe response.  We may have to stop the lisinopril altogether if cough persists

## 2011-06-12 NOTE — Assessment & Plan Note (Signed)
Patient has had chest wall soreness but no chest pain to suggest angina pectoris.  She does have known nonobstructive coronary disease.  She still has chest wall soreness and she should avoid any heavy lifting for at least another month.

## 2011-06-12 NOTE — Progress Notes (Signed)
Erin Haynes Date of Birth:  [birth year] [facility] [address]   [address] [place], [place]  [address] [phone]           Fax   [phone]  History of Present Illness: This pleasant 71 year old [place] American woman is seen for a scheduled followup office visit.  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  Postoperatively the patient has continued to do well.  She has the usual amount of residual chest wall soreness but no angina pectoris.  She has not been aware of any cardiac arrhythmia.  She's had no dizziness or syncope.  She has resumed her preoperative cardiac medications for blood pressure control.  She has had a dry nonproductive cough raising the question of possible cough secondary to lisinopril.  Current Outpatient Prescriptions  Medication Sig Dispense Refill  . amLODipine (NORVASC) 10 MG tablet Take 10 mg by mouth daily.        Marland Kitchen aspirin 325 MG tablet Take 325 mg by mouth daily.        . calcium-vitamin D (OSCAL WITH D) 500-200 MG-UNIT per tablet Take 2 tablets by mouth daily. Chewable       . Coenzyme Q10 (CO Q 10 PO) Take 1 capsule by mouth daily.       Marland Kitchen EVENING PRIMROSE OIL PO Take 500 mg by mouth daily.       . Ginkgo Biloba (GINKOBA PO) Take 2-4 capsules by mouth 2 (two) times daily. Pt takes more if she gets up earlier in the morning.      Marland Kitchen levothyroxine (SYNTHROID, LEVOTHROID) 137 MCG tablet Take 137 mcg by mouth daily.       Marland Kitchen lisinopril (PRINIVIL,ZESTRIL) 20 MG tablet Take 20 mg by mouth daily.       . meclizine (ANTIVERT) 25 MG tablet Take 25 mg by mouth 3 (three) times daily as needed. For nausea and vomiting.      . metFORMIN (GLUCOPHAGE) 500 MG tablet Take 500 mg by mouth daily. Takes 2 tablets in the morning and 1 tablet at night.      . metoprolol (LOPRESSOR) 50 MG tablet Take 50 mg by mouth 2 (two) times daily.        . Multiple Vitamins-Minerals (MULTIVITAL) CHEW Chew 2-3 tablets by mouth daily.         . Omega-3 Fatty Acids (FISH OIL) 1200 MG CAPS Take 1 capsule by mouth daily.       . potassium chloride SA (K-DUR,KLOR-CON) 20 MEQ tablet Take 1 tablet (20 mEq total) by mouth 2 (two) times daily.      . rosuvastatin (CRESTOR) 10 MG tablet Take 10 mg by mouth daily.        . solifenacin (VESICARE) 5 MG tablet Take 5 mg by mouth daily as needed. For travel.      Marland Kitchen spironolactone (ALDACTONE) 25 MG tablet Take 25 mg by mouth daily.        . ST JOHNS WORT PO Take 300 mg by mouth 2 (two) times daily.       Marland Kitchen zolpidem (AMBIEN) 10 MG tablet Take 5 mg by mouth at bedtime as needed. For sleep.      Marland Kitchen DISCONTD: metoprolol (LOPRESSOR) 50 MG tablet Take 1 tablet (50 mg total) by mouth 3 (three) times daily.        Allergies  Allergen Reactions  . Ivp Dye (Iodinated Diagnostic Agents) Anaphylaxis    Throat and  face swelling    Patient Active Problem List  Diagnoses  . Vertigo  . Coronary artery disease  . Osteoarthritis  . Heart palpitations  . Heart murmur  . Back pain  . Dyslipidemia  . Diabetes mellitus  . 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 Myxoma of heart  . Atrial myxoma  . Atrial flutter  . Myxoma    History  Smoking status  . Former Smoker  . Quit date: 06/10/1990  Smokeless tobacco  . Never Used    History  Alcohol Use No    Family History  Problem Relation Age of Onset  . Heart attack Mother     age 41  . Stroke Mother   . Heart attack Father     Review of Systems: Constitutional: no fever chills diaphoresis or fatigue or change in weight.  Head and neck: no hearing loss, no epistaxis, no photophobia or visual disturbance. Respiratory: No cough, shortness of breath or wheezing. Cardiovascular: No chest pain peripheral edema, palpitations. Gastrointestinal: No abdominal distention, no abdominal pain, no change in bowel habits hematochezia or melena. Genitourinary: No dysuria, no frequency, no urgency, no  nocturia. Musculoskeletal:No arthralgias, no back pain, no gait disturbance or myalgias. Neurological: No dizziness, no headaches, no numbness, no seizures, no syncope, no weakness, no tremors. Hematologic: No lymphadenopathy, no easy bruising. Psychiatric: No confusion, no hallucinations, no sleep disturbance.    Physical Exam: Filed Vitals:   06/12/11 1058  BP: 120/80  Pulse: 86   The general appearance reveals a well-developed well-nourished woman in no distress.The head and neck exam reveals pupils equal and reactive.  Extraocular movements are full.  There is no scleral icterus.  The mouth and pharynx are normal.  The neck is supple.  The carotids reveal no bruits.  The jugular venous pressure is normal.  The  thyroid is not enlarged.  There is no lymphadenopathy.  The sternal incision is well-healed. The chest is clear to percussion and auscultation.  There are no rales or rhonchi.  Expansion of the chest is symmetrical.  The precordium is quiet.  The first heart sound is normal.  The second heart sound is physiologically split.  There is no murmur gallop rub or click.  There is no abnormal lift or heave.  The abdomen is soft and nontender.  The bowel sounds are normal.  The liver and spleen are not enlarged.  There are no abdominal masses.  There are no abdominal bruits.  Extremities reveal good pedal pulses.  There is no phlebitis or edema.  There is no cyanosis or clubbing.  Strength is normal and symmetrical in all extremities.  There is no lateralizing weakness.  There are no sensory deficits.  The skin is warm and dry.  There is no rash.  EKG today shows normal sinus rhythm and nonspecific T-wave abnormalities  Assessment / Plan: Continue same medication except cut back on lisinopril and see if cough improves.  Recheck in 2 months for followup office visit and basal metabolic panel and EKG

## 2011-06-12 NOTE — Patient Instructions (Signed)
Decrease your Lisinopril to 20 mg daily Your physician recommends that you schedule a follow-up appointment in: 2 month ov/ekg/bmet

## 2011-06-13 NOTE — Progress Notes (Signed)
Addended by: Kem Parkinson on: 06/13/2011 08:13 AM   Modules accepted: Orders

## 2011-06-20 ENCOUNTER — Encounter (HOSPITAL_COMMUNITY)
Admission: RE | Admit: 2011-06-20 | Discharge: 2011-06-20 | Disposition: A | Discharge status: Home / Self Care | Payer: Self-pay | Source: Ambulatory Visit | Attending: Cardiology | Admitting: Cardiology

## 2011-06-20 DIAGNOSIS — I059 Rheumatic mitral valve disease, unspecified: Secondary | ICD-10-CM | POA: Insufficient documentation

## 2011-06-20 DIAGNOSIS — Z5189 Encounter for other specified aftercare: Secondary | ICD-10-CM | POA: Insufficient documentation

## 2011-06-20 DIAGNOSIS — I509 Heart failure, unspecified: Secondary | ICD-10-CM | POA: Insufficient documentation

## 2011-06-20 DIAGNOSIS — I5033 Acute on chronic diastolic (congestive) heart failure: Secondary | ICD-10-CM | POA: Insufficient documentation

## 2011-06-20 DIAGNOSIS — I2510 Atherosclerotic heart disease of native coronary artery without angina pectoris: Secondary | ICD-10-CM | POA: Insufficient documentation

## 2011-06-20 DIAGNOSIS — I4891 Unspecified atrial fibrillation: Secondary | ICD-10-CM | POA: Insufficient documentation

## 2011-06-20 DIAGNOSIS — G4730 Sleep apnea, unspecified: Secondary | ICD-10-CM | POA: Insufficient documentation

## 2011-06-20 DIAGNOSIS — Z96659 Presence of unspecified artificial knee joint: Secondary | ICD-10-CM | POA: Insufficient documentation

## 2011-06-20 DIAGNOSIS — D151 Benign neoplasm of heart: Secondary | ICD-10-CM | POA: Insufficient documentation

## 2011-06-20 DIAGNOSIS — Z79899 Other long term (current) drug therapy: Secondary | ICD-10-CM | POA: Insufficient documentation

## 2011-06-20 DIAGNOSIS — E119 Type 2 diabetes mellitus without complications: Secondary | ICD-10-CM | POA: Insufficient documentation

## 2011-06-20 DIAGNOSIS — I4892 Unspecified atrial flutter: Secondary | ICD-10-CM | POA: Insufficient documentation

## 2011-06-20 DIAGNOSIS — I498 Other specified cardiac arrhythmias: Secondary | ICD-10-CM | POA: Insufficient documentation

## 2011-06-20 DIAGNOSIS — E785 Hyperlipidemia, unspecified: Secondary | ICD-10-CM | POA: Insufficient documentation

## 2011-06-23 ENCOUNTER — Encounter (HOSPITAL_COMMUNITY)
Admission: RE | Admit: 2011-06-23 | Discharge: 2011-06-23 | Disposition: A | Discharge status: Home / Self Care | Payer: Self-pay | Source: Ambulatory Visit | Attending: Cardiology | Admitting: Cardiology

## 2011-06-25 ENCOUNTER — Encounter (HOSPITAL_COMMUNITY)
Admission: RE | Admit: 2011-06-25 | Discharge: 2011-06-25 | Disposition: A | Discharge status: Home / Self Care | Payer: Self-pay | Source: Ambulatory Visit | Attending: Cardiology | Admitting: Cardiology

## 2011-06-27 ENCOUNTER — Encounter (HOSPITAL_COMMUNITY)
Admission: RE | Admit: 2011-06-27 | Discharge: 2011-06-27 | Disposition: A | Discharge status: Home / Self Care | Payer: Self-pay | Source: Ambulatory Visit | Attending: Cardiology | Admitting: Cardiology

## 2011-06-30 ENCOUNTER — Encounter (HOSPITAL_COMMUNITY): Admission: RE | Admit: 2011-06-30 | Payer: Self-pay | Source: Ambulatory Visit

## 2011-07-02 ENCOUNTER — Encounter (HOSPITAL_COMMUNITY): Admission: RE | Admit: 2011-07-02 | Payer: Self-pay | Source: Ambulatory Visit

## 2011-07-02 ENCOUNTER — Telehealth: Payer: Self-pay | Admitting: *Deleted

## 2011-07-02 DIAGNOSIS — I119 Hypertensive heart disease without heart failure: Secondary | ICD-10-CM

## 2011-07-02 NOTE — Telephone Encounter (Signed)
Dr. Patty Sermons review blood pressure readings from cardiac rehab.  Wants patient to increase Toprol o 100 mg in am and 50 mg in pm.  Left message for patient to call back

## 2011-07-04 ENCOUNTER — Encounter (HOSPITAL_COMMUNITY): Payer: Self-pay

## 2011-07-07 ENCOUNTER — Encounter (HOSPITAL_COMMUNITY)
Admission: RE | Admit: 2011-07-07 | Discharge: 2011-07-07 | Disposition: A | Discharge status: Home / Self Care | Payer: Self-pay | Source: Ambulatory Visit | Attending: Cardiology | Admitting: Cardiology

## 2011-07-07 MED ORDER — METOPROLOL SUCCINATE ER 50 MG PO TB24: ORAL_TABLET | ORAL | Status: DC

## 2011-07-07 NOTE — Telephone Encounter (Signed)
1)Advised of increase medication   2)patient also having some problems with swallowing, but not choking, raspy voice at times, and feeling like something stuck in her throat.  Offered her an appointment but she thought she should see the surgeon if anyone.  Did discuss with  Dr. Patty Sermons and he said could wait and see if improves on its own or could get barium swallow.  She then stated this started about a month after the surgery.  She wants to just wait to see how things go.

## 2011-07-08 NOTE — Telephone Encounter (Signed)
Did discuss further with  Dr. Patty Sermons and called patient again to offer an appointment or swallowing study.  She will think about it and let us know if she wants either

## 2011-07-09 ENCOUNTER — Encounter (HOSPITAL_COMMUNITY)
Admission: RE | Admit: 2011-07-09 | Discharge: 2011-07-09 | Disposition: A | Discharge status: Home / Self Care | Payer: Self-pay | Source: Ambulatory Visit | Attending: Cardiology | Admitting: Cardiology

## 2011-07-11 ENCOUNTER — Encounter (HOSPITAL_COMMUNITY)
Admission: RE | Admit: 2011-07-11 | Discharge: 2011-07-11 | Disposition: A | Discharge status: Home / Self Care | Payer: Self-pay | Source: Ambulatory Visit | Attending: Cardiology | Admitting: Cardiology

## 2011-07-11 DIAGNOSIS — I498 Other specified cardiac arrhythmias: Secondary | ICD-10-CM | POA: Insufficient documentation

## 2011-07-11 DIAGNOSIS — D151 Benign neoplasm of heart: Secondary | ICD-10-CM | POA: Insufficient documentation

## 2011-07-11 DIAGNOSIS — I4891 Unspecified atrial fibrillation: Secondary | ICD-10-CM | POA: Insufficient documentation

## 2011-07-11 DIAGNOSIS — E785 Hyperlipidemia, unspecified: Secondary | ICD-10-CM | POA: Insufficient documentation

## 2011-07-11 DIAGNOSIS — Z79899 Other long term (current) drug therapy: Secondary | ICD-10-CM | POA: Insufficient documentation

## 2011-07-11 DIAGNOSIS — I059 Rheumatic mitral valve disease, unspecified: Secondary | ICD-10-CM | POA: Insufficient documentation

## 2011-07-11 DIAGNOSIS — Z96659 Presence of unspecified artificial knee joint: Secondary | ICD-10-CM | POA: Insufficient documentation

## 2011-07-11 DIAGNOSIS — I4892 Unspecified atrial flutter: Secondary | ICD-10-CM | POA: Insufficient documentation

## 2011-07-11 DIAGNOSIS — I2510 Atherosclerotic heart disease of native coronary artery without angina pectoris: Secondary | ICD-10-CM | POA: Insufficient documentation

## 2011-07-11 DIAGNOSIS — I509 Heart failure, unspecified: Secondary | ICD-10-CM | POA: Insufficient documentation

## 2011-07-11 DIAGNOSIS — Z5189 Encounter for other specified aftercare: Secondary | ICD-10-CM | POA: Insufficient documentation

## 2011-07-11 DIAGNOSIS — I5033 Acute on chronic diastolic (congestive) heart failure: Secondary | ICD-10-CM | POA: Insufficient documentation

## 2011-07-11 DIAGNOSIS — E119 Type 2 diabetes mellitus without complications: Secondary | ICD-10-CM | POA: Insufficient documentation

## 2011-07-11 DIAGNOSIS — G4730 Sleep apnea, unspecified: Secondary | ICD-10-CM | POA: Insufficient documentation

## 2011-07-11 NOTE — Progress Notes (Signed)
Reviewed home exercise guidelines with patient. She has a recumbent bike at home which she is using. Pt voices understanding of instructions given.

## 2011-07-14 ENCOUNTER — Encounter (HOSPITAL_COMMUNITY): Payer: Self-pay

## 2011-07-15 ENCOUNTER — Telehealth: Payer: Self-pay | Admitting: Cardiology

## 2011-07-15 ENCOUNTER — Telehealth: Payer: Self-pay

## 2011-07-15 NOTE — Telephone Encounter (Signed)
Still having problems with swallowing and discomfort with it at times, this all started after surgery.  Discussed with  Dr. Patty Sermons and will have patient call surgery.

## 2011-07-15 NOTE — Telephone Encounter (Signed)
Pt is s/p excision myxoma from 04/17/11. I recommended that she see her pcp for evaluation of sx's, that it could be related to reflux or allergies and not from surgery.  She insists that the sx's originated from the surgery. And would like to be eval here or referred to ENT. Please advise

## 2011-07-15 NOTE — Telephone Encounter (Signed)
New Problem   Patient request return call from nurse regarding recent procedure and throat problems.

## 2011-07-15 NOTE — Telephone Encounter (Signed)
Will have patient call the surgeon, Dr Orvan July office for evaluation

## 2011-07-16 ENCOUNTER — Encounter (HOSPITAL_COMMUNITY)
Admission: RE | Admit: 2011-07-16 | Discharge: 2011-07-16 | Disposition: A | Discharge status: Home / Self Care | Payer: Self-pay | Source: Ambulatory Visit | Attending: Cardiology | Admitting: Cardiology

## 2011-07-16 NOTE — Telephone Encounter (Signed)
07/16/2011 @ 1710 Dr Cornelius Moras recommends for pt to f/u with PCP about sx's. He does not feel this is related to her surgery she had back in 04/2011. Pt notified- [facility]

## 2011-07-18 ENCOUNTER — Encounter (HOSPITAL_COMMUNITY)
Admission: RE | Admit: 2011-07-18 | Discharge: 2011-07-18 | Disposition: A | Discharge status: Home / Self Care | Payer: Self-pay | Source: Ambulatory Visit | Attending: Cardiology | Admitting: Cardiology

## 2011-07-21 ENCOUNTER — Encounter (HOSPITAL_COMMUNITY): Payer: Self-pay

## 2011-07-23 ENCOUNTER — Encounter (HOSPITAL_COMMUNITY): Payer: Self-pay

## 2011-07-25 ENCOUNTER — Encounter (HOSPITAL_COMMUNITY)
Admission: RE | Admit: 2011-07-25 | Discharge: 2011-07-25 | Disposition: A | Discharge status: Home / Self Care | Payer: Medicare Other | Source: Ambulatory Visit | Attending: Cardiology | Admitting: Cardiology

## 2011-07-28 ENCOUNTER — Encounter (HOSPITAL_COMMUNITY)
Admission: RE | Admit: 2011-07-28 | Discharge: 2011-07-28 | Disposition: A | Discharge status: Home / Self Care | Payer: Medicare Other | Source: Ambulatory Visit | Attending: Cardiology | Admitting: Cardiology

## 2011-07-30 ENCOUNTER — Telehealth: Payer: Self-pay | Admitting: Cardiology

## 2011-07-30 ENCOUNTER — Encounter (HOSPITAL_COMMUNITY): Payer: Medicare Other

## 2011-07-30 NOTE — Telephone Encounter (Signed)
New Msg: Pt calling wanting to speak with nurse about how a z-pak would effect pt heart. Please return pt call to discuss further.

## 2011-07-30 NOTE — Telephone Encounter (Signed)
[facility] faxed to [facility]/Dr.Ronald Polite @ [phone] 07/30/11/KM

## 2011-07-30 NOTE — Telephone Encounter (Signed)
I agree that a Z-Pak would be okay for the patient.

## 2011-07-30 NOTE — Telephone Encounter (Signed)
Discussed with  Dr. Patty Sermons and he says ok to take with recent heart surgery.  Advised patient

## 2011-08-01 ENCOUNTER — Encounter (HOSPITAL_COMMUNITY): Payer: Medicare Other

## 2011-08-04 ENCOUNTER — Encounter (HOSPITAL_COMMUNITY): Payer: Medicare Other

## 2011-08-06 ENCOUNTER — Encounter (HOSPITAL_COMMUNITY): Payer: Medicare Other

## 2011-08-08 ENCOUNTER — Encounter (HOSPITAL_COMMUNITY): Payer: Self-pay

## 2011-08-08 DIAGNOSIS — I4891 Unspecified atrial fibrillation: Secondary | ICD-10-CM | POA: Insufficient documentation

## 2011-08-08 DIAGNOSIS — I4892 Unspecified atrial flutter: Secondary | ICD-10-CM | POA: Insufficient documentation

## 2011-08-08 DIAGNOSIS — I509 Heart failure, unspecified: Secondary | ICD-10-CM | POA: Insufficient documentation

## 2011-08-08 DIAGNOSIS — E785 Hyperlipidemia, unspecified: Secondary | ICD-10-CM | POA: Insufficient documentation

## 2011-08-08 DIAGNOSIS — I2510 Atherosclerotic heart disease of native coronary artery without angina pectoris: Secondary | ICD-10-CM | POA: Insufficient documentation

## 2011-08-08 DIAGNOSIS — Z5189 Encounter for other specified aftercare: Secondary | ICD-10-CM | POA: Insufficient documentation

## 2011-08-08 DIAGNOSIS — I498 Other specified cardiac arrhythmias: Secondary | ICD-10-CM | POA: Insufficient documentation

## 2011-08-08 DIAGNOSIS — I5033 Acute on chronic diastolic (congestive) heart failure: Secondary | ICD-10-CM | POA: Insufficient documentation

## 2011-08-08 DIAGNOSIS — G4730 Sleep apnea, unspecified: Secondary | ICD-10-CM | POA: Insufficient documentation

## 2011-08-08 DIAGNOSIS — D151 Benign neoplasm of heart: Secondary | ICD-10-CM | POA: Insufficient documentation

## 2011-08-08 DIAGNOSIS — Z96659 Presence of unspecified artificial knee joint: Secondary | ICD-10-CM | POA: Insufficient documentation

## 2011-08-08 DIAGNOSIS — Z79899 Other long term (current) drug therapy: Secondary | ICD-10-CM | POA: Insufficient documentation

## 2011-08-08 DIAGNOSIS — E119 Type 2 diabetes mellitus without complications: Secondary | ICD-10-CM | POA: Insufficient documentation

## 2011-08-08 DIAGNOSIS — I059 Rheumatic mitral valve disease, unspecified: Secondary | ICD-10-CM | POA: Insufficient documentation

## 2011-08-11 ENCOUNTER — Encounter (HOSPITAL_COMMUNITY): Payer: Self-pay

## 2011-08-13 ENCOUNTER — Encounter (HOSPITAL_COMMUNITY): Payer: Self-pay

## 2011-08-15 ENCOUNTER — Encounter (HOSPITAL_COMMUNITY)
Admission: RE | Admit: 2011-08-15 | Discharge: 2011-08-15 | Disposition: A | Discharge status: Home / Self Care | Payer: Self-pay | Source: Ambulatory Visit | Attending: Cardiology | Admitting: Cardiology

## 2011-08-18 ENCOUNTER — Encounter (HOSPITAL_COMMUNITY): Payer: Self-pay

## 2011-08-18 ENCOUNTER — Ambulatory Visit: Payer: Medicare Other | Admitting: Thoracic Surgery (Cardiothoracic Vascular Surgery)

## 2011-08-20 ENCOUNTER — Encounter (HOSPITAL_COMMUNITY): Payer: Self-pay

## 2011-08-22 ENCOUNTER — Encounter (HOSPITAL_COMMUNITY): Payer: Self-pay

## 2011-08-25 ENCOUNTER — Ambulatory Visit (INDEPENDENT_AMBULATORY_CARE_PROVIDER_SITE_OTHER): Payer: Medicare Other | Admitting: Thoracic Surgery (Cardiothoracic Vascular Surgery)

## 2011-08-25 ENCOUNTER — Encounter (HOSPITAL_COMMUNITY)
Admission: RE | Admit: 2011-08-25 | Discharge: 2011-08-25 | Disposition: A | Discharge status: Home / Self Care | Payer: Self-pay | Source: Ambulatory Visit | Attending: Cardiology | Admitting: Cardiology

## 2011-08-25 ENCOUNTER — Encounter (HOSPITAL_COMMUNITY): Payer: Self-pay

## 2011-08-25 ENCOUNTER — Encounter: Payer: Self-pay | Admitting: Thoracic Surgery (Cardiothoracic Vascular Surgery)

## 2011-08-25 VITALS — BP 145/81 | HR 81 | Resp 16 | Ht 66.0 in | Wt 235.0 lb

## 2011-08-25 DIAGNOSIS — D151 Benign neoplasm of heart: Secondary | ICD-10-CM

## 2011-08-25 DIAGNOSIS — Z09 Encounter for follow-up examination after completed treatment for conditions other than malignant neoplasm: Secondary | ICD-10-CM

## 2011-08-25 NOTE — Progress Notes (Signed)
[address]            [place] [address]          [phone]     CARDIOTHORACIC SURGERY OFFICE NOTE  Referring Provider is Cassell Clement, MD PCP is Katy Apo, MD, MD   HPI:  Patient returns for followup status post resection of left atrial myxoma on 04/17/2011. She was last seen here in our office in December. Since then she was seen by Dr. Annalee Genta because of continued hoarseness of voice. 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 Since then her voice has slowly improved. She has done well from a cardiac standpoint and reports no new problems or complaints. She dropped out of the cardiac rehabilitation program a few weeks ago because she got sick with bronchitis. She denies any tachypalpitations or exertional shortness of breath. The remainder of her review of systems unremarkable.   Current Outpatient Prescriptions  Medication Sig Dispense Refill  . amLODipine (NORVASC) 10 MG tablet Take 10 mg by mouth daily.        Marland Kitchen aspirin 325 MG tablet Take 325 mg by mouth daily.        . calcium-vitamin D (OSCAL WITH D) 500-200 MG-UNIT per tablet Take 2 tablets by mouth daily. Chewable       . Coenzyme Q10 (CO Q 10 PO) Take 1 capsule by mouth daily.       . Ginkgo Biloba (GINKOBA PO) Take 2-4 capsules by mouth 2 (two) times daily. Pt takes more if she gets up earlier in the morning.      Marland Kitchen levothyroxine (SYNTHROID, LEVOTHROID) 137 MCG tablet Take 137 mcg by mouth daily.       . meclizine (ANTIVERT) 25 MG tablet Take 25 mg by mouth 3 (three) times daily as needed. For nausea and vomiting.      . metFORMIN (GLUCOPHAGE) 500 MG tablet Take 500 mg by mouth daily. Takes 2 tablets in the morning and 1 tablet at night.      . metoprolol succinate (TOPROL-XL) 100 MG 24 hr tablet Take 100 mg by mouth daily.       . metoprolol succinate (TOPROL-XL) 50 MG  24 hr tablet 1 tablet in the evening  30 tablet  11  . Multiple Vitamins-Minerals (MULTIVITAL) CHEW Chew 2-3 tablets by mouth daily.        . Omega-3 Fatty Acids (FISH OIL) 1200 MG CAPS Take 1 capsule by mouth daily.       . potassium chloride SA (K-DUR,KLOR-CON) 20 MEQ tablet Take 1 tablet (20 mEq total) by mouth 2 (two) times daily.      . rosuvastatin (CRESTOR) 10 MG tablet Take 10 mg by mouth daily.        Marland Kitchen spironolactone (ALDACTONE) 25 MG tablet Take 25 mg by mouth daily.        . ST JOHNS WORT PO Take 300 mg by mouth 2 (two) times daily.       Marland Kitchen zolpidem (AMBIEN) 10 MG tablet Take 5 mg by mouth at bedtime as needed. For sleep.      Marland Kitchen EVENING PRIMROSE OIL PO Take 500 mg by mouth daily.       . solifenacin (VESICARE) 5 MG tablet Take 5 mg by mouth daily as  needed. For travel.          Physical Exam:   BP 145/81  Pulse 81  Resp 16  Ht 5\' 6"  (1.676 m)  Wt 235 lb (106.595 kg)  BMI 37.93 kg/m2  SpO2 97%  General:  Well-appearing  Chest:   Clear to auscultation  CV:   Regular rate and rhythm without murmur  Incisions:  Sternotomy scar has healed nicely and the sternum is stable on palpation  Abdomen:  Soft and nontender  Extremities:  Warm and well-perfused  Diagnostic Tests:  n/a   Impression:  Patient is doing well more than 3 months following median sternotomy for excision of left atrial myxoma  Plan:  I've strongly encourage the patient to get back into the cardiac rehabilitation program. We have not made any changes in her current medications. In the future she will call and return to see [place] only as needed. All of her questions been addressed.   Salvatore Decent. Cornelius Moras, MD 08/25/2011 12:52 PM

## 2011-08-27 ENCOUNTER — Telehealth: Payer: Self-pay | Admitting: *Deleted

## 2011-08-27 ENCOUNTER — Encounter (HOSPITAL_COMMUNITY)
Admission: RE | Admit: 2011-08-27 | Discharge: 2011-08-27 | Disposition: A | Discharge status: Home / Self Care | Payer: Self-pay | Source: Ambulatory Visit | Attending: Cardiology | Admitting: Cardiology

## 2011-08-27 NOTE — Telephone Encounter (Signed)
Agree with that plan. Mark her chart allergic to lisinopril (cough)

## 2011-08-27 NOTE — Telephone Encounter (Signed)
1) Has been taking her blood pressure at home and it has been doing good.  Does drink coffee before going to rehab and thinks this could be causing it to be up.  She was going start leaving off coffee on days of rehab starting Friday.  2)Dr Polite started her on Diovan 160 mg daily and d/c her Lisinopril secondary to cough.  She has an appointment on Friday with him and will take her blood pressure cuff with her to visit.  Doesn't want to start anything at this time.    Will call us Friday with an update

## 2011-08-27 NOTE — Telephone Encounter (Signed)
Message copied by Burnell Blanks on Wed Aug 27, 2011  2:46 PM ------      Message from: Cassell Clement      Created: Wed Aug 27, 2011 12:51 PM      Regarding: FW: Elevated BP at Cardiac Rehab       Her systolic BPs are elevated.  She is already on amlodipine, metoprolol and spironolactone.  To control BP better, add lisinopril 10 mg daily.  Check BMET in one week.      TB      ----- Message -----         From: Annetta Maw         Sent: 08/27/2011   7:53 AM           To: Cassell Clement, MD      Subject: Elevated BP at Cardiac Rehab                             Greetings Dr. Patty Sermons,            Ms. Losh attends [facility] for her exercise MWF. She continues to have elevated BP readings at rest and with exercise. A copy of her most recent exercise flow sheets is scanned into the EMR under the media tab for your perusal. Please advise as necessary.            Thank you,      Cristy Hilts, Exercise Physiologist

## 2011-08-29 ENCOUNTER — Encounter (HOSPITAL_COMMUNITY): Payer: Self-pay

## 2011-08-29 ENCOUNTER — Telehealth: Payer: Self-pay | Admitting: Cardiology

## 2011-08-29 NOTE — Telephone Encounter (Signed)
Agree with plan 

## 2011-08-29 NOTE — Telephone Encounter (Signed)
Saw PCP today and blood pressure machine is working properly and blood pressure at visit today good.  Will continue to monitor and avoid coffee day of rehab.  PCP didn't feel she needed any additional medications at this time

## 2011-08-29 NOTE — Telephone Encounter (Signed)
New msg Pt wants to talk to you about her BP. She wouldn't give many details. Please call

## 2011-09-01 ENCOUNTER — Encounter (HOSPITAL_COMMUNITY): Payer: Self-pay

## 2011-09-01 ENCOUNTER — Telehealth: Payer: Self-pay | Admitting: Cardiology

## 2011-09-01 NOTE — Telephone Encounter (Signed)
New problem:  Patient calling is it ok to have a hot stone massage. H/O open heart surgery in nov 2012.

## 2011-09-01 NOTE — Telephone Encounter (Signed)
Advised patient this is ok per  Dr. Patty Sermons  Will leave note up front for patient to pick up

## 2011-09-03 ENCOUNTER — Encounter (HOSPITAL_COMMUNITY)
Admission: RE | Admit: 2011-09-03 | Discharge: 2011-09-03 | Disposition: A | Discharge status: Home / Self Care | Payer: Self-pay | Source: Ambulatory Visit | Attending: Cardiology | Admitting: Cardiology

## 2011-09-05 ENCOUNTER — Encounter (HOSPITAL_COMMUNITY)
Admission: RE | Admit: 2011-09-05 | Discharge: 2011-09-05 | Disposition: A | Discharge status: Home / Self Care | Payer: Self-pay | Source: Ambulatory Visit | Attending: Cardiology | Admitting: Cardiology

## 2011-09-08 ENCOUNTER — Encounter (HOSPITAL_COMMUNITY)
Admission: RE | Admit: 2011-09-08 | Discharge: 2011-09-08 | Disposition: A | Discharge status: Home / Self Care | Payer: Self-pay | Source: Ambulatory Visit | Attending: Cardiology | Admitting: Cardiology

## 2011-09-08 DIAGNOSIS — I059 Rheumatic mitral valve disease, unspecified: Secondary | ICD-10-CM | POA: Insufficient documentation

## 2011-09-08 DIAGNOSIS — I498 Other specified cardiac arrhythmias: Secondary | ICD-10-CM | POA: Insufficient documentation

## 2011-09-08 DIAGNOSIS — D151 Benign neoplasm of heart: Secondary | ICD-10-CM | POA: Insufficient documentation

## 2011-09-08 DIAGNOSIS — Z79899 Other long term (current) drug therapy: Secondary | ICD-10-CM | POA: Insufficient documentation

## 2011-09-08 DIAGNOSIS — E119 Type 2 diabetes mellitus without complications: Secondary | ICD-10-CM | POA: Insufficient documentation

## 2011-09-08 DIAGNOSIS — I5033 Acute on chronic diastolic (congestive) heart failure: Secondary | ICD-10-CM | POA: Insufficient documentation

## 2011-09-08 DIAGNOSIS — G4730 Sleep apnea, unspecified: Secondary | ICD-10-CM | POA: Insufficient documentation

## 2011-09-08 DIAGNOSIS — I509 Heart failure, unspecified: Secondary | ICD-10-CM | POA: Insufficient documentation

## 2011-09-08 DIAGNOSIS — I2510 Atherosclerotic heart disease of native coronary artery without angina pectoris: Secondary | ICD-10-CM | POA: Insufficient documentation

## 2011-09-08 DIAGNOSIS — Z96659 Presence of unspecified artificial knee joint: Secondary | ICD-10-CM | POA: Insufficient documentation

## 2011-09-08 DIAGNOSIS — I4892 Unspecified atrial flutter: Secondary | ICD-10-CM | POA: Insufficient documentation

## 2011-09-08 DIAGNOSIS — I4891 Unspecified atrial fibrillation: Secondary | ICD-10-CM | POA: Insufficient documentation

## 2011-09-08 DIAGNOSIS — E785 Hyperlipidemia, unspecified: Secondary | ICD-10-CM | POA: Insufficient documentation

## 2011-09-08 DIAGNOSIS — Z5189 Encounter for other specified aftercare: Secondary | ICD-10-CM | POA: Insufficient documentation

## 2011-09-10 ENCOUNTER — Encounter (HOSPITAL_COMMUNITY)
Admission: RE | Admit: 2011-09-10 | Discharge: 2011-09-10 | Disposition: A | Discharge status: Home / Self Care | Payer: Self-pay | Source: Ambulatory Visit | Attending: Cardiology | Admitting: Cardiology

## 2011-09-12 ENCOUNTER — Encounter (HOSPITAL_COMMUNITY): Payer: Self-pay

## 2011-09-15 ENCOUNTER — Encounter (HOSPITAL_COMMUNITY): Payer: Self-pay

## 2011-09-17 ENCOUNTER — Encounter (HOSPITAL_COMMUNITY): Payer: Self-pay

## 2011-09-19 ENCOUNTER — Encounter (HOSPITAL_COMMUNITY): Payer: Self-pay

## 2011-09-22 ENCOUNTER — Encounter (HOSPITAL_COMMUNITY): Payer: Self-pay

## 2011-09-24 ENCOUNTER — Encounter (HOSPITAL_COMMUNITY)
Admission: RE | Admit: 2011-09-24 | Discharge: 2011-09-24 | Disposition: A | Discharge status: Home / Self Care | Payer: Self-pay | Source: Ambulatory Visit | Attending: Cardiology | Admitting: Cardiology

## 2011-09-26 ENCOUNTER — Encounter (HOSPITAL_COMMUNITY)
Admission: RE | Admit: 2011-09-26 | Discharge: 2011-09-26 | Disposition: A | Discharge status: Home / Self Care | Payer: Self-pay | Source: Ambulatory Visit | Attending: Cardiology | Admitting: Cardiology

## 2011-09-29 ENCOUNTER — Encounter (HOSPITAL_COMMUNITY)
Admission: RE | Admit: 2011-09-29 | Discharge: 2011-09-29 | Disposition: A | Discharge status: Home / Self Care | Payer: Self-pay | Source: Ambulatory Visit | Attending: Cardiology | Admitting: Cardiology

## 2011-10-01 ENCOUNTER — Encounter (HOSPITAL_COMMUNITY)
Admission: RE | Admit: 2011-10-01 | Discharge: 2011-10-01 | Disposition: A | Discharge status: Home / Self Care | Payer: Self-pay | Source: Ambulatory Visit | Attending: Cardiology | Admitting: Cardiology

## 2011-10-03 ENCOUNTER — Encounter (HOSPITAL_COMMUNITY)
Admission: RE | Admit: 2011-10-03 | Discharge: 2011-10-03 | Disposition: A | Discharge status: Home / Self Care | Payer: Self-pay | Source: Ambulatory Visit | Attending: Cardiology | Admitting: Cardiology

## 2011-10-03 NOTE — Progress Notes (Signed)
Fluticasone 50 mcg 2puffs each nostril at bedtime and Omeprazole 40 mg 1 capsule daily @ 5pm added by Dr. Annalee Genta.

## 2011-10-06 ENCOUNTER — Encounter (HOSPITAL_COMMUNITY): Payer: Self-pay

## 2011-10-08 ENCOUNTER — Encounter (HOSPITAL_COMMUNITY): Payer: Self-pay | Attending: Cardiology

## 2011-10-08 DIAGNOSIS — D151 Benign neoplasm of heart: Secondary | ICD-10-CM | POA: Insufficient documentation

## 2011-10-08 DIAGNOSIS — E785 Hyperlipidemia, unspecified: Secondary | ICD-10-CM | POA: Insufficient documentation

## 2011-10-08 DIAGNOSIS — I5033 Acute on chronic diastolic (congestive) heart failure: Secondary | ICD-10-CM | POA: Insufficient documentation

## 2011-10-08 DIAGNOSIS — I2510 Atherosclerotic heart disease of native coronary artery without angina pectoris: Secondary | ICD-10-CM | POA: Insufficient documentation

## 2011-10-08 DIAGNOSIS — Z96659 Presence of unspecified artificial knee joint: Secondary | ICD-10-CM | POA: Insufficient documentation

## 2011-10-08 DIAGNOSIS — I509 Heart failure, unspecified: Secondary | ICD-10-CM | POA: Insufficient documentation

## 2011-10-08 DIAGNOSIS — G4730 Sleep apnea, unspecified: Secondary | ICD-10-CM | POA: Insufficient documentation

## 2011-10-08 DIAGNOSIS — I4891 Unspecified atrial fibrillation: Secondary | ICD-10-CM | POA: Insufficient documentation

## 2011-10-08 DIAGNOSIS — Z79899 Other long term (current) drug therapy: Secondary | ICD-10-CM | POA: Insufficient documentation

## 2011-10-08 DIAGNOSIS — E119 Type 2 diabetes mellitus without complications: Secondary | ICD-10-CM | POA: Insufficient documentation

## 2011-10-08 DIAGNOSIS — I4892 Unspecified atrial flutter: Secondary | ICD-10-CM | POA: Insufficient documentation

## 2011-10-08 DIAGNOSIS — Z5189 Encounter for other specified aftercare: Secondary | ICD-10-CM | POA: Insufficient documentation

## 2011-10-08 DIAGNOSIS — I059 Rheumatic mitral valve disease, unspecified: Secondary | ICD-10-CM | POA: Insufficient documentation

## 2011-10-08 DIAGNOSIS — I498 Other specified cardiac arrhythmias: Secondary | ICD-10-CM | POA: Insufficient documentation

## 2011-10-10 ENCOUNTER — Encounter (HOSPITAL_COMMUNITY): Payer: Self-pay

## 2011-10-13 ENCOUNTER — Encounter (HOSPITAL_COMMUNITY): Payer: Self-pay

## 2011-10-15 ENCOUNTER — Encounter (HOSPITAL_COMMUNITY): Payer: Self-pay

## 2011-10-17 ENCOUNTER — Encounter (HOSPITAL_COMMUNITY): Payer: Self-pay

## 2011-10-20 ENCOUNTER — Encounter (HOSPITAL_COMMUNITY): Payer: Self-pay

## 2011-10-22 ENCOUNTER — Encounter (HOSPITAL_COMMUNITY): Payer: Self-pay

## 2011-10-24 ENCOUNTER — Encounter (HOSPITAL_COMMUNITY): Payer: Self-pay

## 2011-10-27 ENCOUNTER — Encounter (HOSPITAL_COMMUNITY): Payer: Self-pay

## 2011-10-29 ENCOUNTER — Encounter (HOSPITAL_COMMUNITY): Payer: Self-pay

## 2011-10-31 ENCOUNTER — Encounter (HOSPITAL_COMMUNITY): Payer: Self-pay

## 2011-11-03 ENCOUNTER — Encounter (HOSPITAL_COMMUNITY): Payer: Self-pay

## 2011-11-05 ENCOUNTER — Encounter (HOSPITAL_COMMUNITY): Payer: Self-pay

## 2011-11-07 ENCOUNTER — Encounter (HOSPITAL_COMMUNITY): Payer: Self-pay

## 2011-11-10 ENCOUNTER — Encounter (HOSPITAL_COMMUNITY): Payer: BC Managed Care – PPO | Attending: Cardiology

## 2011-11-10 DIAGNOSIS — Z96659 Presence of unspecified artificial knee joint: Secondary | ICD-10-CM | POA: Insufficient documentation

## 2011-11-10 DIAGNOSIS — I059 Rheumatic mitral valve disease, unspecified: Secondary | ICD-10-CM | POA: Insufficient documentation

## 2011-11-10 DIAGNOSIS — Z79899 Other long term (current) drug therapy: Secondary | ICD-10-CM | POA: Insufficient documentation

## 2011-11-10 DIAGNOSIS — E119 Type 2 diabetes mellitus without complications: Secondary | ICD-10-CM | POA: Insufficient documentation

## 2011-11-10 DIAGNOSIS — Z5189 Encounter for other specified aftercare: Secondary | ICD-10-CM | POA: Insufficient documentation

## 2011-11-10 DIAGNOSIS — I4892 Unspecified atrial flutter: Secondary | ICD-10-CM | POA: Insufficient documentation

## 2011-11-10 DIAGNOSIS — I4891 Unspecified atrial fibrillation: Secondary | ICD-10-CM | POA: Insufficient documentation

## 2011-11-10 DIAGNOSIS — D151 Benign neoplasm of heart: Secondary | ICD-10-CM | POA: Insufficient documentation

## 2011-11-10 DIAGNOSIS — G4730 Sleep apnea, unspecified: Secondary | ICD-10-CM | POA: Insufficient documentation

## 2011-11-10 DIAGNOSIS — I2510 Atherosclerotic heart disease of native coronary artery without angina pectoris: Secondary | ICD-10-CM | POA: Insufficient documentation

## 2011-11-10 DIAGNOSIS — E785 Hyperlipidemia, unspecified: Secondary | ICD-10-CM | POA: Insufficient documentation

## 2011-11-10 DIAGNOSIS — I5033 Acute on chronic diastolic (congestive) heart failure: Secondary | ICD-10-CM | POA: Insufficient documentation

## 2011-11-10 DIAGNOSIS — I509 Heart failure, unspecified: Secondary | ICD-10-CM | POA: Insufficient documentation

## 2011-11-10 DIAGNOSIS — I498 Other specified cardiac arrhythmias: Secondary | ICD-10-CM | POA: Insufficient documentation

## 2011-11-12 ENCOUNTER — Encounter (HOSPITAL_COMMUNITY): Payer: BC Managed Care – PPO

## 2011-11-14 ENCOUNTER — Encounter (HOSPITAL_COMMUNITY): Payer: BC Managed Care – PPO

## 2011-11-17 ENCOUNTER — Encounter (HOSPITAL_COMMUNITY): Payer: BC Managed Care – PPO

## 2011-11-19 ENCOUNTER — Encounter (HOSPITAL_COMMUNITY): Payer: BC Managed Care – PPO

## 2011-11-21 ENCOUNTER — Encounter (HOSPITAL_COMMUNITY): Payer: BC Managed Care – PPO

## 2011-11-24 ENCOUNTER — Encounter (HOSPITAL_COMMUNITY): Payer: BC Managed Care – PPO

## 2011-11-26 ENCOUNTER — Encounter (HOSPITAL_COMMUNITY): Payer: BC Managed Care – PPO

## 2011-11-28 ENCOUNTER — Encounter (HOSPITAL_COMMUNITY): Payer: BC Managed Care – PPO

## 2011-12-01 ENCOUNTER — Encounter (HOSPITAL_COMMUNITY): Payer: BC Managed Care – PPO

## 2011-12-03 ENCOUNTER — Encounter (HOSPITAL_COMMUNITY): Payer: BC Managed Care – PPO

## 2011-12-05 ENCOUNTER — Encounter (HOSPITAL_COMMUNITY): Payer: BC Managed Care – PPO

## 2011-12-08 ENCOUNTER — Encounter (HOSPITAL_COMMUNITY): Payer: BC Managed Care – PPO

## 2011-12-10 ENCOUNTER — Encounter (HOSPITAL_COMMUNITY): Payer: BC Managed Care – PPO

## 2011-12-12 ENCOUNTER — Encounter (HOSPITAL_COMMUNITY): Payer: BC Managed Care – PPO

## 2011-12-15 ENCOUNTER — Encounter (HOSPITAL_COMMUNITY): Payer: BC Managed Care – PPO

## 2011-12-17 ENCOUNTER — Encounter (HOSPITAL_COMMUNITY): Payer: BC Managed Care – PPO

## 2011-12-19 ENCOUNTER — Encounter (HOSPITAL_COMMUNITY): Payer: BC Managed Care – PPO

## 2011-12-22 ENCOUNTER — Encounter (HOSPITAL_COMMUNITY): Payer: BC Managed Care – PPO

## 2011-12-24 ENCOUNTER — Encounter (HOSPITAL_COMMUNITY): Payer: BC Managed Care – PPO

## 2011-12-26 ENCOUNTER — Encounter (HOSPITAL_COMMUNITY): Payer: BC Managed Care – PPO

## 2011-12-29 ENCOUNTER — Encounter (HOSPITAL_COMMUNITY): Payer: BC Managed Care – PPO

## 2011-12-31 ENCOUNTER — Ambulatory Visit (INDEPENDENT_AMBULATORY_CARE_PROVIDER_SITE_OTHER): Payer: Medicare Other | Admitting: Cardiology

## 2011-12-31 ENCOUNTER — Encounter (HOSPITAL_COMMUNITY): Payer: BC Managed Care – PPO

## 2011-12-31 ENCOUNTER — Encounter: Payer: Self-pay | Admitting: Cardiology

## 2011-12-31 VITALS — BP 128/78 | HR 83 | Ht 66.0 in | Wt 246.0 lb

## 2011-12-31 DIAGNOSIS — R5381 Other malaise: Secondary | ICD-10-CM

## 2011-12-31 DIAGNOSIS — I2510 Atherosclerotic heart disease of native coronary artery without angina pectoris: Secondary | ICD-10-CM

## 2011-12-31 DIAGNOSIS — R0600 Dyspnea, unspecified: Secondary | ICD-10-CM

## 2011-12-31 DIAGNOSIS — R0609 Other forms of dyspnea: Secondary | ICD-10-CM

## 2011-12-31 DIAGNOSIS — I119 Hypertensive heart disease without heart failure: Secondary | ICD-10-CM

## 2011-12-31 DIAGNOSIS — R5383 Other fatigue: Secondary | ICD-10-CM | POA: Insufficient documentation

## 2011-12-31 NOTE — Assessment & Plan Note (Signed)
Her blood pressure is remaining stable on current medication.  She is doing well off the lisinopril and on valsartan

## 2011-12-31 NOTE — Progress Notes (Signed)
Erin Haynes Date of Birth:  24-May-1941 [facility] [address] [place], [place]  [address] [phone]         Fax   [phone]  History of Present Illness: This pleasant 71 year old woman is seen for a scheduled followup office visit.  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  She has generally done well postoperatively.  In the postoperative period she had a persistent cough which finally went away after we stopped her lisinopril.  Recently she's been complaining of being tired and staying short of breath.  She has been less physically active.  She has gained 10 pounds since we last saw her.  Her primary care physician recently been a battery of blood tests and apparently did not find any problem with thyroid function or an anemia etc. and he placed her on citalopram 10 mg one daily for possible depression although the patient has not started it yet.  Current Outpatient Prescriptions  Medication Sig Dispense Refill  . amLODipine (NORVASC) 10 MG tablet Take 10 mg by mouth daily.        Marland Kitchen aspirin 325 MG tablet Take 325 mg by mouth daily.        . calcium-vitamin D (OSCAL WITH D) 500-200 MG-UNIT per tablet Take 2 tablets by mouth daily. Chewable       . citalopram (CELEXA) 10 MG tablet Take 10 mg by mouth daily.      . Coenzyme Q10 (CO Q 10 PO) Take 1 capsule by mouth daily.       . fluticasone (FLONASE) 50 MCG/ACT nasal spray Place 2 sprays into the nose at bedtime.      . Ginkgo Biloba (GINKOBA PO) Take 2-4 capsules by mouth 2 (two) times daily. Pt takes more if she gets up earlier in the morning.      Marland Kitchen levothyroxine (SYNTHROID, LEVOTHROID) 137 MCG tablet Take 137 mcg by mouth daily.       . meclizine (ANTIVERT) 25 MG tablet Take 25 mg by mouth 3 (three) times daily as needed. For nausea and vomiting.      . metFORMIN (GLUCOPHAGE) 500 MG tablet Take 500 mg by mouth daily. Takes 2 tablets in the morning and 1 tablet at night.       . metoprolol succinate (TOPROL-XL) 100 MG 24 hr tablet Take 100 mg by mouth daily.       . Multiple Vitamins-Minerals (MULTIVITAL) CHEW Chew 2-3 tablets by mouth daily.        Marland Kitchen nystatin-triamcinolone (MYCOLOG II) cream as directed.      . Omega-3 Fatty Acids (FISH OIL) 1200 MG CAPS Take 1 capsule by mouth daily.       . potassium chloride SA (K-DUR,KLOR-CON) 20 MEQ tablet Take 1 tablet (20 mEq total) by mouth 2 (two) times daily.      . rosuvastatin (CRESTOR) 10 MG tablet Take 10 mg by mouth daily.        . solifenacin (VESICARE) 5 MG tablet Take 5 mg by mouth daily as needed. For travel.      Marland Kitchen spironolactone-hydrochlorothiazide (ALDACTAZIDE) 25-25 MG per tablet Daily.      . valsartan (DIOVAN) 160 MG tablet Take 160 mg by mouth daily.      Marland Kitchen zolpidem (AMBIEN) 10 MG tablet Take 5 mg by mouth at bedtime as needed. For sleep.      Marland Kitchen DISCONTD: metoprolol succinate (TOPROL-XL) 50 MG 24 hr tablet 1  tablet in the evening  30 tablet  11    Allergies  Allergen Reactions  . Ivp Dye (Iodinated Diagnostic Agents) Anaphylaxis    Throat and face swelling  . Lisinopril     Cough     Patient Active Problem List  Diagnosis  . Vertigo  . Coronary artery disease  . Osteoarthritis  . Heart palpitations  . Heart murmur  . Back pain  . Dyslipidemia  . Diabetes mellitus  . 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 Myxoma of heart  . Atrial myxoma  . Atrial flutter  . Myxoma    History  Smoking status  . Former Smoker  . Quit date: 06/10/1990  Smokeless tobacco  . Never Used    History  Alcohol Use No    Family History  Problem Relation Age of Onset  . Heart attack Mother     age 68  . Stroke Mother   . Heart attack Father     Review of Systems: Constitutional: no fever chills diaphoresis or fatigue or change in weight.  Head and neck: no hearing loss, no epistaxis, no photophobia or visual disturbance. Respiratory: No cough, shortness of breath or  wheezing. Cardiovascular: No chest pain peripheral edema, palpitations. Gastrointestinal: No abdominal distention, no abdominal pain, no change in bowel habits hematochezia or melena. Genitourinary: No dysuria, no frequency, no urgency, no nocturia. Musculoskeletal:No arthralgias, no back pain, no gait disturbance or myalgias. Neurological: No dizziness, no headaches, no numbness, no seizures, no syncope, no weakness, no tremors. Hematologic: No lymphadenopathy, no easy bruising. Psychiatric: No confusion, no hallucinations, no sleep disturbance.    Physical Exam: Filed Vitals:   12/31/11 1136  BP: 128/78  Pulse: 83   General appearance reveals a well-developed well-nourished large woman in no distress.The head and neck exam reveals pupils equal and reactive.  Extraocular movements are full.  There is no scleral icterus.  The mouth and pharynx are normal.  The neck is supple.  The carotids reveal no bruits.  The jugular venous pressure is normal.  The  thyroid is not enlarged.  There is no lymphadenopathy.  The chest is clear to percussion and auscultation.  There are no rales or rhonchi.  Expansion of the chest is symmetrical.  The precordium is quiet.  The first heart sound is normal.  The second heart sound is physiologically split.  There is a grade 2/6 systolic ejection murmur at the base.  There is no abnormal lift or heave.  The abdomen is soft and nontender.  The bowel sounds are normal.  The liver and spleen are not enlarged.  There are no abdominal masses.  There are no abdominal bruits.  Extremities reveal good pedal pulses.  There is no phlebitis or edema.  There is no cyanosis or clubbing.  Strength is normal and symmetrical in all extremities.  There is no lateralizing weakness.  There are no sensory deficits.  The skin is warm and dry.  There is no rash.  EKG today shows normal sinus rhythm and no ischemic changes  Assessment / Plan: Continue same medication.  Start the  citalopram as previously prescribed by her internist.  We are checking a chest x-ray today because of her complaints of dyspnea about her lungs are clear to auscultation.  Recheck in 3 months for followup office visit.  She has a trip to [place] planned for next week and I think that she should proceed with that trip as planned

## 2011-12-31 NOTE — Patient Instructions (Addendum)
Will obtain chest xray and call you with the results   Your physician recommends that you continue on your current medications as directed. Please refer to the Current Medication list given to you today.  Your physician recommends that you schedule a follow-up appointment in: 3 months

## 2011-12-31 NOTE — Assessment & Plan Note (Signed)
The patient appears to be mildly depressed.  She has lost interest in things.  She doesn't feel like cooking or going out much.  I encouraged her to go ahead and start the citalopram that her internist had prescribed

## 2012-01-02 ENCOUNTER — Encounter (HOSPITAL_COMMUNITY): Payer: BC Managed Care – PPO

## 2012-01-05 ENCOUNTER — Encounter (HOSPITAL_COMMUNITY): Payer: BC Managed Care – PPO

## 2012-01-07 ENCOUNTER — Encounter (HOSPITAL_COMMUNITY): Payer: BC Managed Care – PPO

## 2012-01-09 ENCOUNTER — Encounter (HOSPITAL_COMMUNITY): Payer: BC Managed Care – PPO

## 2012-01-12 ENCOUNTER — Encounter (HOSPITAL_COMMUNITY): Payer: BC Managed Care – PPO

## 2012-01-14 ENCOUNTER — Encounter (HOSPITAL_COMMUNITY): Payer: BC Managed Care – PPO

## 2012-01-16 ENCOUNTER — Encounter (HOSPITAL_COMMUNITY): Payer: BC Managed Care – PPO

## 2012-01-19 ENCOUNTER — Encounter (HOSPITAL_COMMUNITY): Payer: BC Managed Care – PPO

## 2012-01-21 ENCOUNTER — Encounter (HOSPITAL_COMMUNITY): Payer: BC Managed Care – PPO

## 2012-01-23 ENCOUNTER — Encounter (HOSPITAL_COMMUNITY): Payer: BC Managed Care – PPO

## 2012-01-26 ENCOUNTER — Encounter (HOSPITAL_COMMUNITY): Payer: BC Managed Care – PPO

## 2012-01-28 ENCOUNTER — Encounter (HOSPITAL_COMMUNITY): Payer: BC Managed Care – PPO

## 2012-01-30 ENCOUNTER — Encounter (HOSPITAL_COMMUNITY): Payer: BC Managed Care – PPO

## 2012-02-02 ENCOUNTER — Encounter (HOSPITAL_COMMUNITY): Payer: BC Managed Care – PPO

## 2012-02-04 ENCOUNTER — Encounter (HOSPITAL_COMMUNITY): Payer: BC Managed Care – PPO

## 2012-02-06 ENCOUNTER — Encounter (HOSPITAL_COMMUNITY): Payer: BC Managed Care – PPO

## 2012-02-09 ENCOUNTER — Encounter (HOSPITAL_COMMUNITY): Payer: BC Managed Care – PPO

## 2012-02-11 ENCOUNTER — Encounter (HOSPITAL_COMMUNITY): Payer: BC Managed Care – PPO

## 2012-02-13 ENCOUNTER — Encounter (HOSPITAL_COMMUNITY): Payer: BC Managed Care – PPO

## 2012-02-16 ENCOUNTER — Encounter (HOSPITAL_COMMUNITY): Payer: BC Managed Care – PPO

## 2012-02-18 ENCOUNTER — Telehealth: Payer: Self-pay | Admitting: Cardiology

## 2012-02-18 ENCOUNTER — Encounter (HOSPITAL_COMMUNITY): Payer: BC Managed Care – PPO

## 2012-02-18 DIAGNOSIS — R0602 Shortness of breath: Secondary | ICD-10-CM

## 2012-02-18 NOTE — Telephone Encounter (Signed)
Reviewed last note/ cxr was requested, pt now wanting to do, order placed and pt informed. Forwarded to nurse for update.

## 2012-02-18 NOTE — Telephone Encounter (Signed)
New Problem:    Patient called in trying to get an X-ray ordered for herself.  Patient claims she called and left a message on Monday and this is the second time she is calling this week.  Please call back.

## 2012-02-20 ENCOUNTER — Encounter (HOSPITAL_COMMUNITY): Payer: BC Managed Care – PPO

## 2012-02-23 ENCOUNTER — Encounter (HOSPITAL_COMMUNITY): Payer: BC Managed Care – PPO

## 2012-02-25 ENCOUNTER — Encounter (HOSPITAL_COMMUNITY): Payer: BC Managed Care – PPO

## 2012-02-27 ENCOUNTER — Encounter (HOSPITAL_COMMUNITY): Payer: BC Managed Care – PPO

## 2012-03-01 ENCOUNTER — Encounter (HOSPITAL_COMMUNITY): Payer: BC Managed Care – PPO

## 2012-03-03 ENCOUNTER — Encounter (HOSPITAL_COMMUNITY): Payer: BC Managed Care – PPO

## 2012-03-03 ENCOUNTER — Ambulatory Visit
Admission: RE | Admit: 2012-03-03 | Discharge: 2012-03-03 | Disposition: A | Discharge status: Home / Self Care | Payer: Medicare Other | Source: Ambulatory Visit | Attending: Cardiology | Admitting: Cardiology

## 2012-03-03 DIAGNOSIS — R0602 Shortness of breath: Secondary | ICD-10-CM

## 2012-03-05 ENCOUNTER — Telehealth: Payer: Self-pay | Admitting: *Deleted

## 2012-03-05 ENCOUNTER — Encounter (HOSPITAL_COMMUNITY): Payer: BC Managed Care – PPO

## 2012-03-05 NOTE — Telephone Encounter (Signed)
Advised patient of chest xray 

## 2012-03-05 NOTE — Telephone Encounter (Signed)
Message copied by Burnell Blanks on Fri Mar 05, 2012  6:10 PM ------      Message from: Cassell Clement      Created: Wed Mar 03, 2012  9:18 PM       Chest xray is okay.  No CHF seen. Heart size normal.

## 2012-03-08 ENCOUNTER — Encounter (HOSPITAL_COMMUNITY): Payer: BC Managed Care – PPO

## 2012-03-10 ENCOUNTER — Encounter (HOSPITAL_COMMUNITY): Payer: BC Managed Care – PPO

## 2012-03-12 ENCOUNTER — Encounter (HOSPITAL_COMMUNITY): Payer: BC Managed Care – PPO

## 2012-03-15 ENCOUNTER — Encounter (HOSPITAL_COMMUNITY): Payer: BC Managed Care – PPO

## 2012-03-17 ENCOUNTER — Encounter (HOSPITAL_COMMUNITY): Payer: BC Managed Care – PPO

## 2012-03-19 ENCOUNTER — Encounter (HOSPITAL_COMMUNITY): Payer: BC Managed Care – PPO

## 2012-03-22 ENCOUNTER — Encounter (HOSPITAL_COMMUNITY): Payer: BC Managed Care – PPO

## 2012-03-23 ENCOUNTER — Ambulatory Visit: Payer: Medicare Other | Admitting: Cardiology

## 2012-03-24 ENCOUNTER — Encounter (HOSPITAL_COMMUNITY): Payer: BC Managed Care – PPO

## 2012-03-26 ENCOUNTER — Encounter (HOSPITAL_COMMUNITY): Payer: BC Managed Care – PPO

## 2012-03-29 ENCOUNTER — Encounter (HOSPITAL_COMMUNITY): Payer: BC Managed Care – PPO

## 2012-03-31 ENCOUNTER — Encounter (HOSPITAL_COMMUNITY): Payer: BC Managed Care – PPO

## 2012-04-02 ENCOUNTER — Encounter (HOSPITAL_COMMUNITY): Payer: BC Managed Care – PPO

## 2012-04-05 ENCOUNTER — Encounter (HOSPITAL_COMMUNITY): Payer: BC Managed Care – PPO

## 2012-04-07 ENCOUNTER — Encounter (HOSPITAL_COMMUNITY): Payer: BC Managed Care – PPO

## 2012-04-09 ENCOUNTER — Encounter (HOSPITAL_COMMUNITY): Payer: BC Managed Care – PPO

## 2012-04-12 ENCOUNTER — Encounter (HOSPITAL_COMMUNITY): Payer: BC Managed Care – PPO

## 2012-04-14 ENCOUNTER — Encounter (HOSPITAL_COMMUNITY): Payer: BC Managed Care – PPO

## 2012-04-16 ENCOUNTER — Encounter (HOSPITAL_COMMUNITY): Payer: BC Managed Care – PPO

## 2012-04-19 ENCOUNTER — Encounter (HOSPITAL_COMMUNITY): Payer: BC Managed Care – PPO

## 2012-04-21 ENCOUNTER — Encounter (HOSPITAL_COMMUNITY): Payer: BC Managed Care – PPO

## 2012-04-23 ENCOUNTER — Encounter (HOSPITAL_COMMUNITY): Payer: BC Managed Care – PPO

## 2012-04-26 ENCOUNTER — Encounter: Payer: Self-pay | Admitting: Cardiology

## 2012-04-26 ENCOUNTER — Encounter (HOSPITAL_COMMUNITY): Payer: BC Managed Care – PPO

## 2012-04-26 ENCOUNTER — Ambulatory Visit (INDEPENDENT_AMBULATORY_CARE_PROVIDER_SITE_OTHER): Payer: Medicare Other | Admitting: Cardiology

## 2012-04-26 VITALS — BP 130/70 | HR 72 | Ht 67.0 in | Wt 243.0 lb

## 2012-04-26 DIAGNOSIS — D151 Benign neoplasm of heart: Secondary | ICD-10-CM

## 2012-04-26 DIAGNOSIS — R5383 Other fatigue: Secondary | ICD-10-CM

## 2012-04-26 DIAGNOSIS — R5381 Other malaise: Secondary | ICD-10-CM

## 2012-04-26 DIAGNOSIS — I4892 Unspecified atrial flutter: Secondary | ICD-10-CM

## 2012-04-26 DIAGNOSIS — R0609 Other forms of dyspnea: Secondary | ICD-10-CM

## 2012-04-26 DIAGNOSIS — R0600 Dyspnea, unspecified: Secondary | ICD-10-CM

## 2012-04-26 DIAGNOSIS — E7800 Pure hypercholesterolemia, unspecified: Secondary | ICD-10-CM

## 2012-04-26 DIAGNOSIS — E785 Hyperlipidemia, unspecified: Secondary | ICD-10-CM

## 2012-04-26 NOTE — Progress Notes (Signed)
Erin Haynes Date of Birth:  1941-02-12 [facility] [address] [place], [place]  [address] [phone]         Fax   [phone]  History of Present Illness: This pleasant 70 year old woman is seen for a scheduled followup office visit. 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  She does not have any significant coronary artery disease.  She had a cardiac catheterization preop on 04/15/11 which showed only mild coronary artery disease with a 40-50% ostial diagonal 1 lesion. She has generally done well postoperatively. In the postoperative period she had a persistent cough which finally went away after we stopped her lisinopril. Recently she's been complaining of being tired and staying short of breath.  The patient has also been having increased difficulty with ambulation.  She walks with a rolling walker.  Current Outpatient Prescriptions  Medication Sig Dispense Refill  . amLODipine (NORVASC) 10 MG tablet Take 10 mg by mouth daily.        Marland Kitchen aspirin 325 MG tablet Take 325 mg by mouth daily.        . calcium-vitamin D (OSCAL WITH D) 500-200 MG-UNIT per tablet Take 2 tablets by mouth daily. Chewable       . citalopram (CELEXA) 10 MG tablet Take 10 mg by mouth daily.      . Coenzyme Q10 (CO Q 10 PO) Take 1 capsule by mouth daily.       . fluticasone (FLONASE) 50 MCG/ACT nasal spray Place 2 sprays into the nose at bedtime.      . Ginkgo Biloba (GINKOBA PO) Take 2-4 capsules by mouth 2 (two) times daily. Pt takes more if she gets up earlier in the morning.      Marland Kitchen levothyroxine (SYNTHROID, LEVOTHROID) 137 MCG tablet Take 137 mcg by mouth daily.       . meclizine (ANTIVERT) 25 MG tablet Take 25 mg by mouth 3 (three) times daily as needed. For nausea and vomiting.      . metFORMIN (GLUCOPHAGE) 500 MG tablet Take 500 mg by mouth daily. Takes 2 tablets in the morning and 1 tablet at night.      . metoprolol succinate (TOPROL-XL) 100 MG 24  hr tablet Take 100 mg by mouth daily.       . Multiple Vitamins-Minerals (MULTIVITAL) CHEW Chew 2-3 tablets by mouth daily.        . Omega-3 Fatty Acids (FISH OIL) 1200 MG CAPS Take 1 capsule by mouth daily.       . potassium chloride SA (K-DUR,KLOR-CON) 20 MEQ tablet Take 1 tablet (20 mEq total) by mouth 2 (two) times daily.      . solifenacin (VESICARE) 5 MG tablet Take 5 mg by mouth daily as needed. For travel.      Marland Kitchen spironolactone-hydrochlorothiazide (ALDACTAZIDE) 25-25 MG per tablet Daily.      . valsartan (DIOVAN) 160 MG tablet Take 160 mg by mouth daily.      Marland Kitchen zolpidem (AMBIEN) 10 MG tablet Take 5 mg by mouth at bedtime as needed. For sleep.        Allergies  Allergen Reactions  . Ivp Dye (Iodinated Diagnostic Agents) Anaphylaxis    Throat and face swelling  . Lisinopril     Cough     Patient Active Problem List  Diagnosis  . Vertigo  . Coronary artery disease  . Osteoarthritis  . Heart palpitations  . Heart murmur  .  Back pain  . Dyslipidemia  . Diabetes mellitus  . 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 Myxoma of heart  . Atrial myxoma  . Atrial flutter  . Myxoma  . Malaise and fatigue    History  Smoking status  . Former Smoker  . Quit date: 06/10/1990  Smokeless tobacco  . Never Used    History  Alcohol Use No    Family History  Problem Relation Age of Onset  . Heart attack Mother     age 14  . Stroke Mother   . Heart attack Father     Review of Systems: Constitutional: no fever chills diaphoresis or fatigue or change in weight.  Head and neck: no hearing loss, no epistaxis, no photophobia or visual disturbance. Respiratory: No cough, shortness of breath or wheezing. Cardiovascular: No chest pain peripheral edema, palpitations. Gastrointestinal: No abdominal distention, no abdominal pain, no change in bowel habits hematochezia or melena. Genitourinary: No dysuria, no frequency, no urgency, no nocturia. Musculoskeletal:No  arthralgias, no back pain, no gait disturbance or myalgias. Neurological: No dizziness, no headaches, no numbness, no seizures, no syncope, no weakness, no tremors. Hematologic: No lymphadenopathy, no easy bruising. Psychiatric: No confusion, no hallucinations, no sleep disturbance.    Physical Exam: Filed Vitals:   04/26/12 1455  BP: 130/70  Pulse: 72   the general appearance reveals a well-developed overweight woman in no distress.The head and neck exam reveals pupils equal and reactive.  Extraocular movements are full.  There is no scleral icterus.  The mouth and pharynx are normal.  The neck is supple.  The carotids reveal no bruits.  The jugular venous pressure is normal.  The  thyroid is not enlarged.  There is no lymphadenopathy.  The chest is clear to percussion and auscultation.  There are no rales or rhonchi.  Expansion of the chest is symmetrical.  The precordium is quiet.  The first heart sound is normal.  The second heart sound is physiologically split.  There is no murmur gallop rub or click.  There is no abnormal lift or heave.  The abdomen is soft and nontender.  The bowel sounds are normal.  The liver and spleen are not enlarged.  There are no abdominal masses.  There are no abdominal bruits.  Extremities reveal good pedal pulses.  There is no phlebitis or edema.  There is no cyanosis or clubbing.  Strength appears to be decreased in the lower extremities and she has an unusual foot slapping gait when she walks without her walker.  There is no lateralizing weakness.  There are no sensory deficits.  The skin is warm and dry.  There is no rash.    Assessment / Plan: We are stopping her Crestor and empirically at this point to see if leg weakness will improve.  She will also consult her back specialist at [facility]. In regard to her exertional dyspnea we will get a two-dimensional echocardiogram.  Of note is the fact that she had a chest x-ray 03/03/12 which showed a normal  heart size. Recheck in 6 months for followup office visit EKG lipid panel hepatic function panel and basal metabolic panel.

## 2012-04-26 NOTE — Assessment & Plan Note (Signed)
The patient has significant leg weakness.  She has a unusual slapping gait when she walks.  She appears to have possible foot drop bilaterally.  She has had previous back surgery by Dr. Wyline Mood at [facility] about 7 years ago and I encouraged her to see him again.  She may also need to see a neurologist.  She has been on Crestor for her dyslipidemia and because of her leg weakness we will stop Crestor at this point.  She does not have significant coronary disease by cath.  She will continue with a low cholesterol diet.  She will try to lose weight.  Since last visit she has lost only 3 pounds and she remains overweight.

## 2012-04-26 NOTE — Assessment & Plan Note (Signed)
Patient complains of malaise and fatigue.  Her primary care provider has given her some citalopram to help with possible underlying depression.  He has also run a full battery of lab tests.

## 2012-04-26 NOTE — Patient Instructions (Signed)
STOP YOUR CRESTOR (watch your diet)  Your physician has requested that you have an echocardiogram. Echocardiography is a painless test that uses sound waves to create images of your heart. It provides your doctor with information about the size and shape of your heart and how well your heart's chambers and valves are working. This procedure takes approximately one hour. There are no restrictions for this procedure.  Your physician wants you to follow-up in: 6 months with fasting labs and ekg (lp/bmet/hfp)  You will receive a reminder letter in the mail two months in advance. If you don't receive a letter, please call our office to schedule the follow-up appointment.

## 2012-04-26 NOTE — Assessment & Plan Note (Signed)
The patient has had no recurrence of atrial flutter or fibrillation since the early postoperative period.  No TIA symptoms.

## 2012-04-28 ENCOUNTER — Encounter (HOSPITAL_COMMUNITY): Payer: BC Managed Care – PPO

## 2012-04-30 ENCOUNTER — Encounter (HOSPITAL_COMMUNITY): Payer: Self-pay | Admitting: Internal Medicine

## 2012-04-30 ENCOUNTER — Ambulatory Visit (HOSPITAL_COMMUNITY): Payer: Medicare Other | Attending: Cardiology | Admitting: Radiology

## 2012-04-30 ENCOUNTER — Encounter (HOSPITAL_COMMUNITY): Payer: BC Managed Care – PPO

## 2012-04-30 ENCOUNTER — Other Ambulatory Visit: Payer: Self-pay

## 2012-04-30 DIAGNOSIS — R0600 Dyspnea, unspecified: Secondary | ICD-10-CM

## 2012-04-30 DIAGNOSIS — I4892 Unspecified atrial flutter: Secondary | ICD-10-CM | POA: Insufficient documentation

## 2012-04-30 DIAGNOSIS — D151 Benign neoplasm of heart: Secondary | ICD-10-CM

## 2012-04-30 DIAGNOSIS — E119 Type 2 diabetes mellitus without complications: Secondary | ICD-10-CM | POA: Insufficient documentation

## 2012-04-30 DIAGNOSIS — E785 Hyperlipidemia, unspecified: Secondary | ICD-10-CM | POA: Insufficient documentation

## 2012-04-30 DIAGNOSIS — I2510 Atherosclerotic heart disease of native coronary artery without angina pectoris: Secondary | ICD-10-CM | POA: Insufficient documentation

## 2012-04-30 DIAGNOSIS — R5383 Other fatigue: Secondary | ICD-10-CM | POA: Insufficient documentation

## 2012-04-30 DIAGNOSIS — I10 Essential (primary) hypertension: Secondary | ICD-10-CM | POA: Insufficient documentation

## 2012-04-30 DIAGNOSIS — R0609 Other forms of dyspnea: Secondary | ICD-10-CM | POA: Insufficient documentation

## 2012-04-30 DIAGNOSIS — R5381 Other malaise: Secondary | ICD-10-CM | POA: Insufficient documentation

## 2012-04-30 DIAGNOSIS — R0989 Other specified symptoms and signs involving the circulatory and respiratory systems: Secondary | ICD-10-CM | POA: Insufficient documentation

## 2012-04-30 NOTE — Progress Notes (Signed)
Echocardiogram performed.  

## 2012-05-03 ENCOUNTER — Encounter (HOSPITAL_COMMUNITY): Payer: BC Managed Care – PPO

## 2012-05-03 ENCOUNTER — Telehealth: Payer: Self-pay | Admitting: *Deleted

## 2012-05-03 NOTE — Telephone Encounter (Signed)
Advised patient

## 2012-05-03 NOTE — Telephone Encounter (Signed)
Message copied by Burnell Blanks on Mon May 03, 2012  4:28 PM ------      Message from: Cassell Clement      Created: Sun May 02, 2012  5:54 PM       Please report. LV systolic function is good. No significant valve abnormalities. No recurrence of myxoma. CSD

## 2012-05-05 ENCOUNTER — Encounter (HOSPITAL_COMMUNITY): Payer: BC Managed Care – PPO

## 2012-05-07 ENCOUNTER — Encounter (HOSPITAL_COMMUNITY): Payer: BC Managed Care – PPO

## 2012-05-10 ENCOUNTER — Encounter (HOSPITAL_COMMUNITY): Payer: BC Managed Care – PPO

## 2012-05-12 ENCOUNTER — Encounter (HOSPITAL_COMMUNITY): Payer: BC Managed Care – PPO

## 2012-05-14 ENCOUNTER — Encounter (HOSPITAL_COMMUNITY): Payer: BC Managed Care – PPO

## 2012-05-17 ENCOUNTER — Encounter (HOSPITAL_COMMUNITY): Payer: BC Managed Care – PPO

## 2012-05-19 ENCOUNTER — Encounter (HOSPITAL_COMMUNITY): Payer: BC Managed Care – PPO

## 2012-05-21 ENCOUNTER — Encounter (HOSPITAL_COMMUNITY): Payer: BC Managed Care – PPO

## 2012-05-24 ENCOUNTER — Encounter (HOSPITAL_COMMUNITY): Payer: BC Managed Care – PPO

## 2012-05-26 ENCOUNTER — Encounter (HOSPITAL_COMMUNITY): Payer: BC Managed Care – PPO

## 2012-05-28 ENCOUNTER — Encounter (HOSPITAL_COMMUNITY): Payer: BC Managed Care – PPO

## 2012-05-31 ENCOUNTER — Encounter (HOSPITAL_COMMUNITY): Payer: BC Managed Care – PPO

## 2012-06-02 ENCOUNTER — Encounter (HOSPITAL_COMMUNITY): Payer: BC Managed Care – PPO

## 2012-06-04 ENCOUNTER — Encounter (HOSPITAL_COMMUNITY): Payer: BC Managed Care – PPO

## 2012-06-07 ENCOUNTER — Encounter (HOSPITAL_COMMUNITY): Payer: BC Managed Care – PPO

## 2012-06-09 ENCOUNTER — Encounter (HOSPITAL_COMMUNITY): Payer: BC Managed Care – PPO

## 2012-06-11 ENCOUNTER — Encounter (HOSPITAL_COMMUNITY): Payer: BC Managed Care – PPO

## 2012-06-14 ENCOUNTER — Encounter (HOSPITAL_COMMUNITY): Payer: BC Managed Care – PPO

## 2012-06-16 ENCOUNTER — Encounter (HOSPITAL_COMMUNITY): Payer: BC Managed Care – PPO

## 2012-06-18 ENCOUNTER — Encounter (HOSPITAL_COMMUNITY): Admission: RE | Admit: 2012-06-18 | Payer: BC Managed Care – PPO | Source: Ambulatory Visit

## 2012-06-21 ENCOUNTER — Encounter (HOSPITAL_COMMUNITY): Payer: BC Managed Care – PPO

## 2012-06-23 ENCOUNTER — Encounter (HOSPITAL_COMMUNITY): Payer: BC Managed Care – PPO

## 2012-06-25 ENCOUNTER — Encounter (HOSPITAL_COMMUNITY): Payer: BC Managed Care – PPO

## 2012-06-28 ENCOUNTER — Encounter (HOSPITAL_COMMUNITY): Payer: BC Managed Care – PPO

## 2012-06-30 ENCOUNTER — Encounter (HOSPITAL_COMMUNITY): Payer: BC Managed Care – PPO

## 2012-07-02 ENCOUNTER — Encounter (HOSPITAL_COMMUNITY): Payer: BC Managed Care – PPO

## 2012-07-05 ENCOUNTER — Encounter (HOSPITAL_COMMUNITY): Payer: BC Managed Care – PPO

## 2012-07-07 ENCOUNTER — Encounter (HOSPITAL_COMMUNITY): Payer: BC Managed Care – PPO

## 2012-07-09 ENCOUNTER — Encounter (HOSPITAL_COMMUNITY): Payer: BC Managed Care – PPO

## 2012-10-03 IMAGING — CR DG CHEST 1V PORT
1 series · 1 of 1 positions shown · non-contrast
Comparison: 04/11/2011

CLINICAL DATA: Coronary bypass, intubation

PORTABLE CHEST - 1 VIEW

[AP]
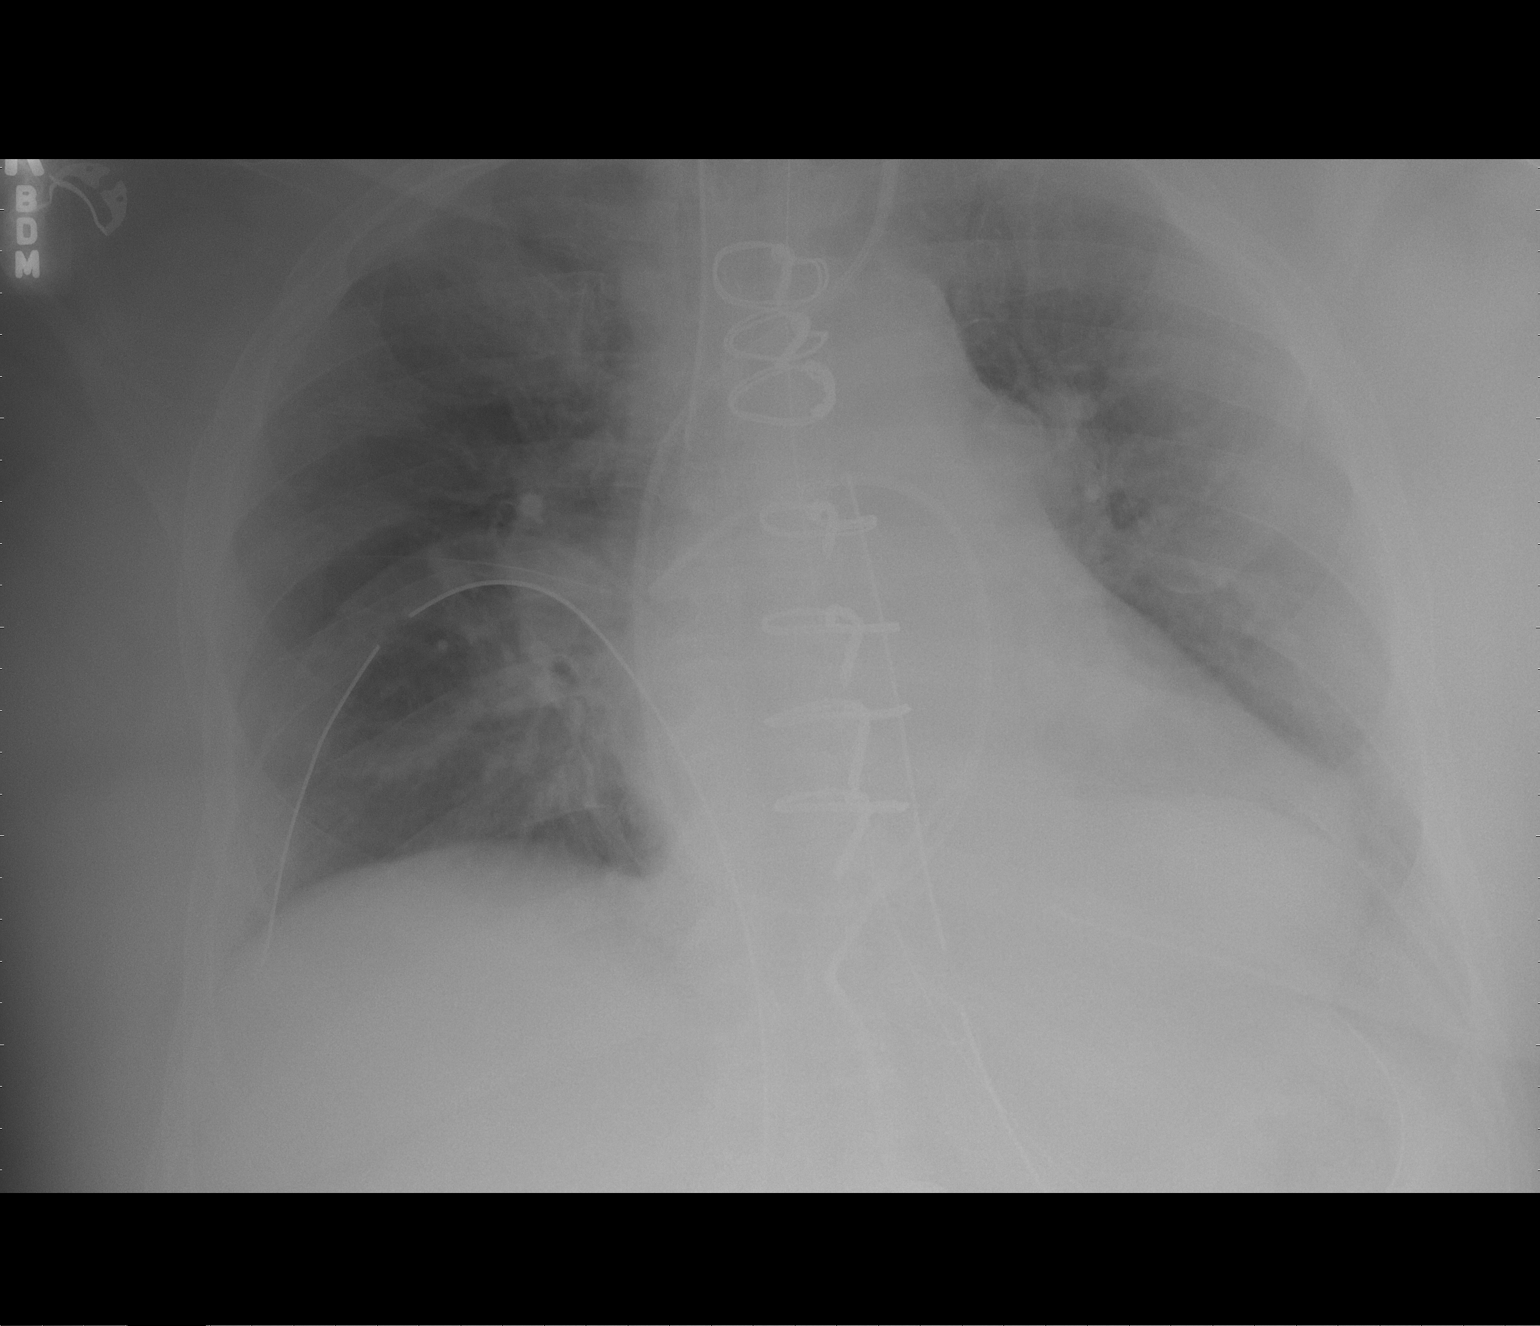

[1 of 1 positions shown; findings below may reference images not displayed]

FINDINGS: Slight rotation to the right.  Limited exam related to
patient size and portable technique.  Endotracheal tube is slightly
low measuring 1.7 cm above the carina.  This could be retracted 2
cm.  Left IJ approach Swan-Ganz catheter tip extends into the
proximal right pulmonary artery.  NG tube coiled in the stomach.
Mediastinal drains and right chest tube noted.  The cardiac
silhouette is enlarged.  Prominent mediastinal contours with
central vascular congestion evident.  Minor scattered atelectasis.
No significant effusion.  Negative for large pneumothorax.
IMPRESSION: Low endotracheal tube, could be retracted 2 cm.

Cardiomegaly with vascular congestion and basilar atelectasis

No pneumothorax

## 2012-10-05 IMAGING — CR DG CHEST 1V PORT
1 series · 1 of 1 positions shown · non-contrast
Comparison: 04/18/2011

CLINICAL DATA: Status post heart surgery

PORTABLE CHEST - 1 VIEW

[AP]
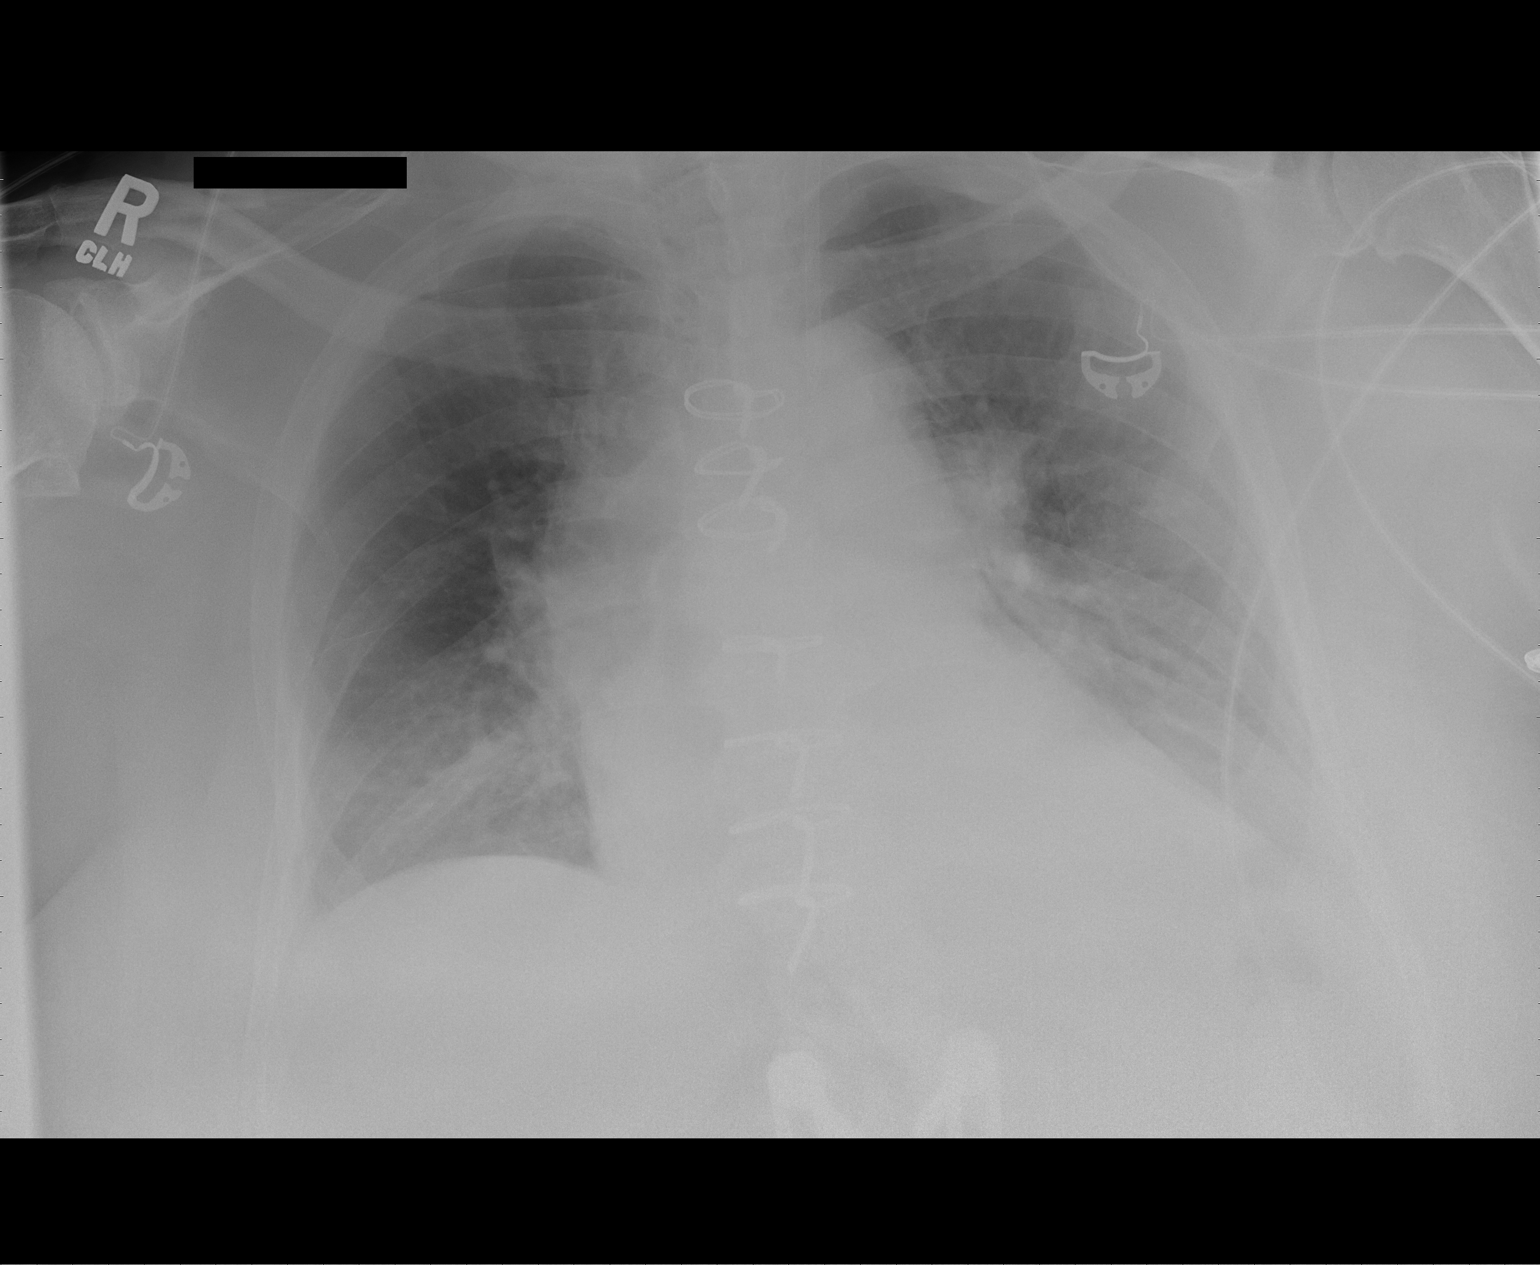

[1 of 1 positions shown; findings below may reference images not displayed]

FINDINGS: The patient is status post median sternotomy and CABG
procedure.

Mild cardiac enlargement.

Low lung volumes.

There is mild interstitial edema and small posterior layering
effusions, similar to previous exam.  No complications after
removal of the Swan-Ganz catheter and right chest tube.
IMPRESSION: 1.  Stable mild edema and effusions.

## 2012-10-06 IMAGING — CR DG CHEST 1V PORT
1 series · 1 of 1 positions shown · non-contrast
Comparison: 04/19/2011; 04/18/2011; 04/17/2011

CLINICAL DATA: Post CABG

PORTABLE CHEST - 1 VIEW

[AP]
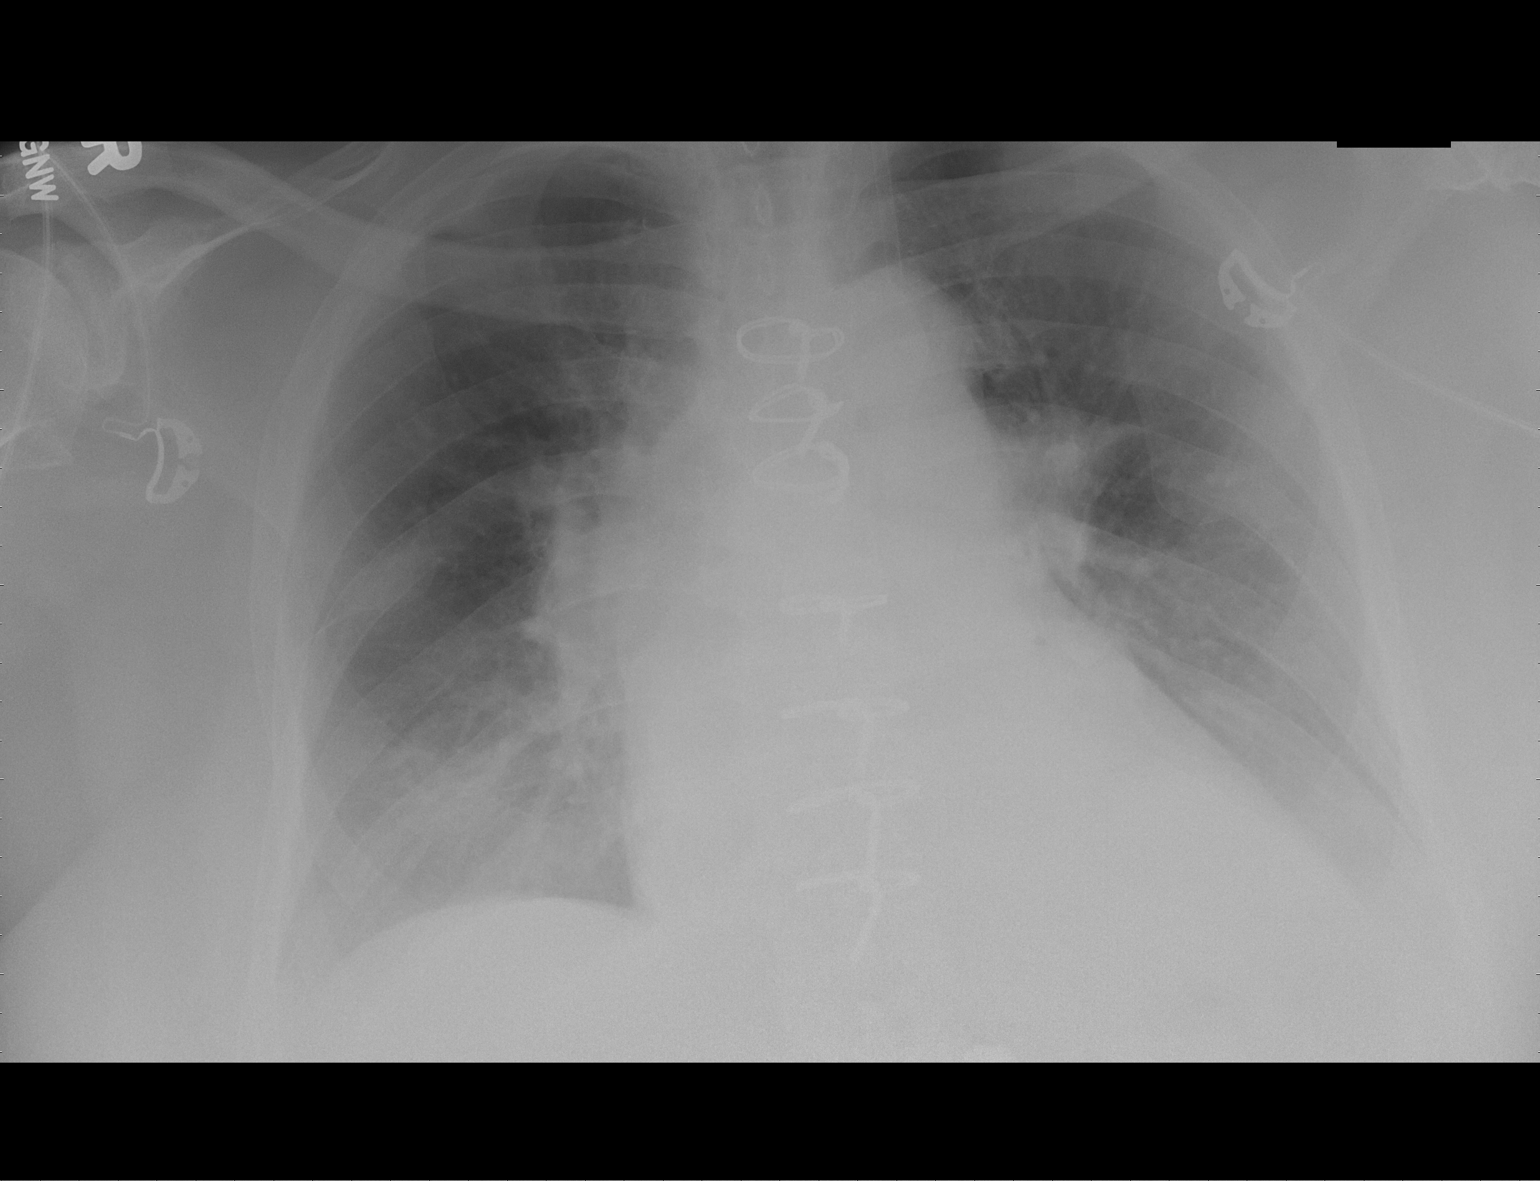

[1 of 1 positions shown; findings below may reference images not displayed]

FINDINGS: Unchanged enlarged cardiac silhouette and mediastinal
contours post median sternotomy. Stable position of support
apparatus.  No pneumothorax.  Grossly unchanged bibasilar and
perihilar heterogeneous opacities.  Minimally improved conspicuity
of the pulmonary interstitium.  Persistent blunting of bilateral
costophrenic angles may suggest small effusions, left greater than
right.  Unchanged bones.
IMPRESSION: Findings suggestive of minimally improved edema with persistent
perihilar and bibasilar opacities, possibly atelectasis.

## 2013-01-11 ENCOUNTER — Encounter: Payer: Self-pay | Admitting: Cardiology

## 2013-01-12 ENCOUNTER — Encounter: Payer: Self-pay | Admitting: Cardiology

## 2013-02-03 ENCOUNTER — Encounter: Payer: Self-pay | Admitting: Cardiology

## 2013-02-09 ENCOUNTER — Ambulatory Visit (INDEPENDENT_AMBULATORY_CARE_PROVIDER_SITE_OTHER): Payer: Medicare (Managed Care) | Admitting: Cardiology

## 2013-02-09 ENCOUNTER — Encounter: Payer: Self-pay | Admitting: Cardiology

## 2013-02-09 VITALS — BP 132/78 | HR 67 | Ht 67.0 in | Wt 240.8 lb

## 2013-02-09 DIAGNOSIS — D151 Benign neoplasm of heart: Secondary | ICD-10-CM

## 2013-02-09 DIAGNOSIS — R5383 Other fatigue: Secondary | ICD-10-CM

## 2013-02-09 DIAGNOSIS — R5381 Other malaise: Secondary | ICD-10-CM

## 2013-02-09 DIAGNOSIS — I119 Hypertensive heart disease without heart failure: Secondary | ICD-10-CM

## 2013-02-09 NOTE — Patient Instructions (Signed)
Your physician recommends that you continue on your current medications as directed. Please refer to the Current Medication list given to you today.  Your physician wants you to follow-up in: 6 months with fasting labs (lp/bmet/hfp)  You will receive a reminder letter in the mail two months in advance. If you don't receive a letter, please call our office to schedule the follow-up appointment.  

## 2013-02-09 NOTE — Assessment & Plan Note (Signed)
Patient has not had any symptoms of congestive heart failure.  She's not having any palpitations or chest pain or angina.

## 2013-02-09 NOTE — Progress Notes (Signed)
Erin Haynes Date of Birth:  12-Aug-1940 [facility] [address] [place], [place]  [address] [phone]         Fax   [phone]  History of Present Illness: This pleasant 72 year old woman is seen for a scheduled followup office visit. 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 She does not have any significant coronary artery disease. She had a cardiac catheterization preop on 04/15/11 which showed only mild coronary artery disease with a 40-50% ostial diagonal 1 lesion. She has generally done well postoperatively. In the postoperative period she had a persistent cough which finally went away after we stopped her lisinopril. Recently she's been complaining of being tired and staying short of breath. The patient has also been having increased difficulty with ambulation. She walks with a rolling walker.  When we last saw her she was having some leg weakness and we stopped her Crestor.  However her leg weakness did not change after stopping the Crestor and subsequently her PCP resumed her Crestor and she does not appear to be having any difficulty from it.   Current Outpatient Prescriptions  Medication Sig Dispense Refill  . amLODipine (NORVASC) 10 MG tablet Take 10 mg by mouth daily.        Marland Kitchen aspirin 325 MG tablet Take 325 mg by mouth daily.        . citalopram (CELEXA) 10 MG tablet Take 20 mg by mouth daily.       . Coenzyme Q10 (CO Q 10 PO) Take 1 capsule by mouth daily.       . fluticasone (FLONASE) 50 MCG/ACT nasal spray Place 2 sprays into the nose at bedtime.      . gabapentin (NEURONTIN) 100 MG capsule Take 100 mg by mouth 3 (three) times daily.       . Ginkgo Biloba (GINKOBA PO) Take 2-4 capsules by mouth 2 (two) times daily. Pt takes more if she gets up earlier in the morning.      Marland Kitchen levothyroxine (SYNTHROID, LEVOTHROID) 137 MCG tablet Take 137 mcg by mouth daily.       . meclizine (ANTIVERT) 25 MG tablet Take 25 mg by  mouth 3 (three) times daily as needed. For nausea and vomiting.      . metFORMIN (GLUCOPHAGE) 500 MG tablet Take 500 mg by mouth daily. Takes 2 tablets in the morning and 1 tablet at night.      . metoprolol succinate (TOPROL-XL) 100 MG 24 hr tablet Take 100 mg by mouth daily.       . Omega-3 Fatty Acids (FISH OIL) 1200 MG CAPS Take 1 capsule by mouth daily.       . potassium chloride SA (K-DUR,KLOR-CON) 20 MEQ tablet Take 1 tablet (20 mEq total) by mouth 2 (two) times daily.      . rosuvastatin (CRESTOR) 10 MG tablet Take 10 mg by mouth daily.      . solifenacin (VESICARE) 5 MG tablet Take 5 mg by mouth daily as needed. For travel.      Marland Kitchen spironolactone-hydrochlorothiazide (ALDACTAZIDE) 25-25 MG per tablet Daily.      . valsartan (DIOVAN) 160 MG tablet Take 160 mg by mouth daily.      Marland Kitchen zolpidem (AMBIEN) 10 MG tablet Take 5 mg by mouth at bedtime as needed. For sleep.       No current facility-administered medications for this visit.    Allergies  Allergen Reactions  .  Ivp Dye [Iodinated Diagnostic Agents] Anaphylaxis    Throat and face swelling  . Lisinopril     Cough     Patient Active Problem List   Diagnosis Date Noted  . Malaise and fatigue 12/31/2011  . Myxoma 05/19/2011  . Atrial flutter 04/21/2011  . Atrial myxoma 04/17/2011  . Myxoma of heart 04/07/2011  . Dyslipidemia 03/03/2011  . Diabetes mellitus 03/03/2011  . Benign hypertensive heart disease without heart failure 03/03/2011  . Vertigo   . Coronary artery disease   . Osteoarthritis   . Heart palpitations   . Heart murmur   . Back pain     History  Smoking status  . Former Smoker  . Quit date: 06/10/1990  Smokeless tobacco  . Never Used    History  Alcohol Use No    Family History  Problem Relation Age of Onset  . Heart attack Mother     age 26  . Stroke Mother   . Heart attack Father     Review of Systems: Constitutional: no fever chills diaphoresis or fatigue or change in weight.  Head  and neck: no hearing loss, no epistaxis, no photophobia or visual disturbance. Respiratory: No cough, shortness of breath or wheezing. Cardiovascular: No chest pain peripheral edema, palpitations. Gastrointestinal: No abdominal distention, no abdominal pain, no change in bowel habits hematochezia or melena. Genitourinary: No dysuria, no frequency, no urgency, no nocturia. Musculoskeletal:No arthralgias, no back pain, no gait disturbance or myalgias. Neurological: No dizziness, no headaches, no numbness, no seizures, no syncope, no weakness, no tremors. Hematologic: No lymphadenopathy, no easy bruising. Psychiatric: No confusion, no hallucinations, no sleep disturbance.    Physical Exam: Filed Vitals:   02/09/13 1144  BP: 132/78  Pulse: 67   the general appearance reveals a well-developed well-nourished woman in no distress.The head and neck exam reveals pupils equal and reactive.  Extraocular movements are full.  There is no scleral icterus.  The mouth and pharynx are normal.  The neck is supple.  The carotids reveal no bruits.  The jugular venous pressure is normal.  The  thyroid is not enlarged.  There is no lymphadenopathy.  The chest is clear to percussion and auscultation.  There are no rales or rhonchi.  Expansion of the chest is symmetrical.  The precordium is quiet.  The first heart sound is normal.  The second heart sound is physiologically split.  There is no murmur gallop rub or click.  There is no abnormal lift or heave.  The abdomen is soft and nontender.  The bowel sounds are normal.  The liver and spleen are not enlarged.  There are no abdominal masses.  There are no abdominal bruits.  Extremities reveal good pedal pulses.  There is no phlebitis or edema.  There is no cyanosis or clubbing.  Strength is normal and symmetrical in all extremities.  There is no lateralizing weakness.  There are no sensory deficits.  The skin is warm and dry.  There is no rash.  EKG shows normal sinus  rhythm and no ischemic changes.  Assessment / Plan: Overall the patient is doing well.  Continue same medication.  Continue to work on weight loss.  Recheck in 6 months for office visit and EKG

## 2013-02-09 NOTE — Assessment & Plan Note (Signed)
Patient still complains of some lack of energy but no real change over the past 8 months

## 2013-02-09 NOTE — Assessment & Plan Note (Signed)
Blood pressure is remaining stable on current therapy.  Her weight is down 3 pounds since last visit.

## 2014-01-31 ENCOUNTER — Ambulatory Visit: Payer: Medicare (Managed Care) | Admitting: Cardiology

## 2014-02-06 ENCOUNTER — Encounter: Payer: Self-pay | Admitting: Cardiology

## 2014-02-06 ENCOUNTER — Ambulatory Visit (INDEPENDENT_AMBULATORY_CARE_PROVIDER_SITE_OTHER): Payer: Medicare Other | Admitting: Cardiology

## 2014-02-06 VITALS — BP 120/56 | HR 64 | Wt 242.8 lb

## 2014-02-06 DIAGNOSIS — E785 Hyperlipidemia, unspecified: Secondary | ICD-10-CM

## 2014-02-06 DIAGNOSIS — D151 Benign neoplasm of heart: Secondary | ICD-10-CM

## 2014-02-06 DIAGNOSIS — R05 Cough: Secondary | ICD-10-CM

## 2014-02-06 DIAGNOSIS — R059 Cough, unspecified: Secondary | ICD-10-CM

## 2014-02-06 DIAGNOSIS — I119 Hypertensive heart disease without heart failure: Secondary | ICD-10-CM

## 2014-02-06 NOTE — Progress Notes (Signed)
Erin Haynes Date of Birth:  February 12, 1941 [facility] [address] [place], [place]  [address] [phone]        Fax   [phone]   History of Present Illness: This pleasant 73 year old woman is seen for a one-year followup office visit. 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 She does not have any significant coronary artery disease. She had a cardiac catheterization preop on 04/15/11 which showed only mild coronary artery disease with a 40-50% ostial diagonal 1 lesion. She has generally done well postoperatively.  She states that she has had a cough since her heart surgery.  Previous notes indicate that it was felt that the cough was secondary to lisinopril and that the cough improved after lisinopril was stopped.  However in retrospect she doesn't think that the cough never really went away.  She has been on valsartan more recently.   Current Outpatient Prescriptions  Medication Sig Dispense Refill  . amLODipine (NORVASC) 10 MG tablet Take 10 mg by mouth daily.        Marland Kitchen aspirin 81 MG tablet Take 81 mg by mouth daily.      . citalopram (CELEXA) 10 MG tablet Take 20 mg by mouth daily.       . Coenzyme Q10 (CO Q 10 PO) Take 1 capsule by mouth daily.       . fluticasone (FLONASE) 50 MCG/ACT nasal spray Place 2 sprays into the nose at bedtime.      . gabapentin (NEURONTIN) 100 MG capsule Take 100 mg by mouth 3 (three) times daily.       Marland Kitchen JANUVIA 50 MG tablet       . levothyroxine (SYNTHROID, LEVOTHROID) 137 MCG tablet Take 137 mcg by mouth daily.       . meclizine (ANTIVERT) 25 MG tablet Take 25 mg by mouth 3 (three) times daily as needed. For nausea and vomiting.      . metoprolol succinate (TOPROL-XL) 100 MG 24 hr tablet Take 100 mg by mouth daily.       . Omega-3 Fatty Acids (FISH OIL) 1200 MG CAPS Take 1 capsule by mouth daily.       . potassium chloride SA (K-DUR,KLOR-CON) 20 MEQ tablet Take 1 tablet (20 mEq total) by  mouth 2 (two) times daily.      . rosuvastatin (CRESTOR) 10 MG tablet Take 10 mg by mouth daily.      . solifenacin (VESICARE) 5 MG tablet Take 5 mg by mouth daily as needed. For travel.      Marland Kitchen spironolactone-hydrochlorothiazide (ALDACTAZIDE) 25-25 MG per tablet Daily.      Marland Kitchen zolpidem (AMBIEN) 10 MG tablet Take 5 mg by mouth at bedtime as needed. For sleep.       No current facility-administered medications for this visit.    Allergies  Allergen Reactions  . Ivp Dye [Iodinated Diagnostic Agents] Anaphylaxis    Throat and face swelling  . Lisinopril     Cough   . Valsartan     COUGH     Patient Active Problem List   Diagnosis Date Noted  . Cough 02/06/2014  . Malaise and fatigue 12/31/2011  . Myxoma 05/19/2011  . Atrial flutter 04/21/2011  . Atrial myxoma 04/17/2011  . Myxoma of heart 04/07/2011  . Dyslipidemia 03/03/2011  . Diabetes mellitus 03/03/2011  . Benign hypertensive heart disease without heart failure 03/03/2011  . Vertigo   .  Coronary artery disease   . Osteoarthritis   . Heart palpitations   . Heart murmur   . Back pain     History  Smoking status  . Former Smoker  . Quit date: 06/10/1990  Smokeless tobacco  . Never Used    History  Alcohol Use No    Family History  Problem Relation Age of Onset  . Heart attack Mother     age 11  . Stroke Mother   . Heart attack Father     Review of Systems: Constitutional: no fever chills diaphoresis or fatigue or change in weight.  Head and neck: no hearing loss, no epistaxis, no photophobia or visual disturbance. Respiratory: No cough, shortness of breath or wheezing. Cardiovascular: No chest pain peripheral edema, palpitations. Gastrointestinal: No abdominal distention, no abdominal pain, no change in bowel habits hematochezia or melena. Genitourinary: No dysuria, no frequency, no urgency, no nocturia. Musculoskeletal:No arthralgias, no back pain, no gait disturbance or myalgias. Neurological: No  dizziness, no headaches, no numbness, no seizures, no syncope, no weakness, no tremors. Hematologic: No lymphadenopathy, no easy bruising. Psychiatric: No confusion, no hallucinations, no sleep disturbance.    Physical Exam: Filed Vitals:   02/06/14 1648  BP: 120/56  Pulse: 64  The patient appears to be in no distress.  She walks with a rolling walker.  Head and neck exam reveals that the pupils are equal and reactive.  The extraocular movements are full.  There is no scleral icterus.  Mouth and pharynx are benign.  No lymphadenopathy.  No carotid bruits.  The jugular venous pressure is normal.  Thyroid is not enlarged or tender.  Chest is clear to percussion and auscultation.  No rales or rhonchi.  Expansion of the chest is symmetrical.  Heart reveals no abnormal lift or heave.  First and second heart sounds are normal.  There is no murmur gallop rub or click.  The abdomen is soft and nontender.  Bowel sounds are normoactive.  There is no hepatosplenomegaly or mass.  There are no abdominal bruits.  Extremities reveal no phlebitis or edema.  Pedal pulses are good.  There is no cyanosis or clubbing.  Neurologic exam is normal strength and no lateralizing weakness.  No sensory deficits.  Integument reveals no rash  EKG shows normal sinus rhythm and no ischemic changes and is unchanged since 02/09/13  Assessment / Plan: 1. past history of left atrial myxoma removed 2012 2. Hypercholesterolemia 3. diabetes mellitus 4. essential hypertension without heart failure. 5. persistent nonproductive cough  Plan: Get chest x-ray.  Her last x-ray was in 2013. Stop valsartan and see if cough improves We are reducing her aspirin to just 81 mg daily Recheck here in one year for office visit and EKG.  Continue close followup with Dr. Delfina Redwood

## 2014-02-06 NOTE — Patient Instructions (Signed)
DECREASE YOUR ASPIRIN TO 42 MG DAILY  STOP YOUR VALSARTAN   A chest x-ray takes a picture of the organs and structures inside the chest, including the heart, lungs, and blood vessels. This test can show several things, including, whether the heart is enlarges; whether fluid is building up in the lungs; and whether pacemaker / defibrillator leads are still in place. [place]  Your physician wants you to follow-up in: [place] will receive a reminder letter in the mail two months in advance. If you don't receive a letter, please call our office to schedule the follow-up appointment.

## 2014-02-06 NOTE — Assessment & Plan Note (Signed)
She has a deep nonproductive cough.  Valsartan may be playing a role.  We will stop valsartan and see if cough improves.  We will also get a chest x-ray to be sure nothing else is going on.

## 2014-02-06 NOTE — Assessment & Plan Note (Signed)
Blood pressure has been stable on current therapy.  She's not had any symptoms of CHF.

## 2014-02-06 NOTE — Assessment & Plan Note (Signed)
The patient has been tolerating low dose Crestor without side effects.  Her lipids are followed by her PCP

## 2014-02-08 ENCOUNTER — Ambulatory Visit
Admission: RE | Admit: 2014-02-08 | Discharge: 2014-02-08 | Disposition: A | Discharge status: Home / Self Care | Payer: Medicare Other | Source: Ambulatory Visit | Attending: Cardiology | Admitting: Cardiology

## 2014-02-08 DIAGNOSIS — R059 Cough, unspecified: Secondary | ICD-10-CM

## 2014-02-08 DIAGNOSIS — R05 Cough: Secondary | ICD-10-CM

## 2014-02-17 ENCOUNTER — Encounter (HOSPITAL_COMMUNITY): Payer: Self-pay | Admitting: *Deleted

## 2014-03-02 ENCOUNTER — Ambulatory Visit (HOSPITAL_COMMUNITY): Admission: RE | Admit: 2014-03-02 | Payer: Medicare Other | Source: Ambulatory Visit | Admitting: Gastroenterology

## 2014-03-02 HISTORY — DX: Chronic kidney disease, unspecified: N18.9

## 2014-03-02 HISTORY — DX: Hypothyroidism, unspecified: E03.9

## 2014-03-02 HISTORY — DX: Personal history of other medical treatment: Z92.89

## 2014-03-02 HISTORY — DX: Adverse effect of unspecified anesthetic, initial encounter: T41.45XA

## 2014-03-02 SURGERY — COLONOSCOPY WITH PROPOFOL
Anesthesia: Monitor Anesthesia Care

## 2014-03-30 ENCOUNTER — Telehealth: Payer: Self-pay | Admitting: Cardiology

## 2014-03-30 NOTE — Telephone Encounter (Signed)
I don't think the cough is related to her heart.  Apparently not from her ACEi or ARB therapy since it persists off those drugs. I suggest she talk to her PCP Dr. Delfina Redwood about sending her to a pulmonary specialist.

## 2014-03-30 NOTE — Telephone Encounter (Signed)
New message     Pt is still having cough and congestion in chest.  Please call

## 2014-03-30 NOTE — Telephone Encounter (Signed)
Patient still has the cough and congestion even after stopping the Valsartan States when she swallows her pills sometimes she feels she has to lean her head back to swallow them good Will forward to  Dr. Mare Ferrari for review

## 2014-03-31 NOTE — Telephone Encounter (Signed)
Advised patient, verbalized understanding  

## 2015-01-29 ENCOUNTER — Other Ambulatory Visit: Payer: Self-pay | Admitting: Nephrology

## 2015-01-29 DIAGNOSIS — N183 Chronic kidney disease, stage 3 unspecified: Secondary | ICD-10-CM

## 2015-02-01 ENCOUNTER — Ambulatory Visit
Admission: RE | Admit: 2015-02-01 | Discharge: 2015-02-01 | Disposition: A | Discharge status: Home / Self Care | Payer: Medicare Other | Source: Ambulatory Visit | Attending: Nephrology | Admitting: Nephrology

## 2015-02-01 DIAGNOSIS — N183 Chronic kidney disease, stage 3 unspecified: Secondary | ICD-10-CM

## 2015-03-12 ENCOUNTER — Ambulatory Visit (INDEPENDENT_AMBULATORY_CARE_PROVIDER_SITE_OTHER): Payer: Medicare Other | Admitting: Physician Assistant

## 2015-03-12 ENCOUNTER — Encounter: Payer: Self-pay | Admitting: Physician Assistant

## 2015-03-12 VITALS — BP 132/70 | HR 59 | Ht 67.0 in | Wt 238.0 lb

## 2015-03-12 DIAGNOSIS — R0602 Shortness of breath: Secondary | ICD-10-CM | POA: Diagnosis not present

## 2015-03-12 DIAGNOSIS — R05 Cough: Secondary | ICD-10-CM

## 2015-03-12 DIAGNOSIS — D151 Benign neoplasm of heart: Secondary | ICD-10-CM

## 2015-03-12 DIAGNOSIS — I2510 Atherosclerotic heart disease of native coronary artery without angina pectoris: Secondary | ICD-10-CM

## 2015-03-12 DIAGNOSIS — R059 Cough, unspecified: Secondary | ICD-10-CM

## 2015-03-12 DIAGNOSIS — R609 Edema, unspecified: Secondary | ICD-10-CM

## 2015-03-12 DIAGNOSIS — E785 Hyperlipidemia, unspecified: Secondary | ICD-10-CM

## 2015-03-12 DIAGNOSIS — R600 Localized edema: Secondary | ICD-10-CM | POA: Insufficient documentation

## 2015-03-12 DIAGNOSIS — R053 Chronic cough: Secondary | ICD-10-CM

## 2015-03-12 NOTE — Assessment & Plan Note (Signed)
Followed by Dr Polite 

## 2015-03-12 NOTE — Patient Instructions (Signed)
Medication Instructions:  1) Please start taking your SpironalactoneHCTZ three times daily  Labwork: None  Testing/Procedures: None  Follow-Up: Please establish with a Cardiologist in [place]  Any Other Special Instructions Will Be Listed Below (If Applicable).  You have been referred to Pulmonology.    Low-Sodium Eating Plan Sodium raises blood pressure and causes water to be held in the body. Getting less sodium from food will help lower your blood pressure, reduce any swelling, and protect your heart, liver, and kidneys. We get sodium by adding salt (sodium chloride) to food. Most of our sodium comes from canned, boxed, and frozen foods. Restaurant foods, fast foods, and pizza are also very high in sodium. Even if you take medicine to lower your blood pressure or to reduce fluid in your body, getting less sodium from your food is important. WHAT IS MY PLAN? Most people should limit their sodium intake to 2,300 mg a day. Your health care provider recommends that you limit your sodium intake to 2,000 mg _____ a day.  WHAT DO I NEED TO KNOW ABOUT THIS EATING PLAN? For the low-sodium eating plan, you will follow these general guidelines:  Choose foods with a % Daily Value for sodium of less than 5% (as listed on the food label).   Use salt-free seasonings or herbs instead of table salt or sea salt.   Check with your health care provider or pharmacist before using salt substitutes.   Eat fresh foods.  Eat more vegetables and fruits.  Limit canned vegetables. If you do use them, rinse them well to decrease the sodium.   Limit cheese to 1 oz (28 g) per day.   Eat lower-sodium products, often labeled as "lower sodium" or "no salt added."  Avoid foods that contain monosodium glutamate (MSG). MSG is sometimes added to [place] food and some canned foods.  Check food labels (Nutrition Facts labels) on foods to learn how much sodium is in one serving.  Eat more  home-cooked food and less restaurant, buffet, and fast food.  When eating at a restaurant, ask that your food be prepared with less salt or none, if possible.  HOW DO I READ FOOD LABELS FOR SODIUM INFORMATION? The Nutrition Facts label lists the amount of sodium in one serving of the food. If you eat more than one serving, you must multiply the listed amount of sodium by the number of servings. Food labels may also identify foods as:  Sodium free--Less than 5 mg in a serving.  Very low sodium--35 mg or less in a serving.  Low sodium--140 mg or less in a serving.  Light in sodium--50% less sodium in a serving. For example, if a food that usually has 300 mg of sodium is changed to become light in sodium, it will have 150 mg of sodium.  Reduced sodium--25% less sodium in a serving. For example, if a food that usually has 400 mg of sodium is changed to reduced sodium, it will have 300 mg of sodium. WHAT FOODS CAN I EAT? Grains Low-sodium cereals, including oats, puffed wheat and rice, and shredded wheat cereals. Low-sodium crackers. Unsalted rice and pasta. Lower-sodium bread.  Vegetables Frozen or fresh vegetables. Low-sodium or reduced-sodium canned vegetables. Low-sodium or reduced-sodium tomato sauce and paste. Low-sodium or reduced-sodium tomato and vegetable juices.  Fruits Fresh, frozen, and canned fruit. Fruit juice.  Meat and Other Protein Products Low-sodium canned tuna and salmon. Fresh or frozen meat, poultry, seafood, and fish. Lamb. Unsalted nuts. Dried beans, peas, and  lentils without added salt. Unsalted canned beans. Homemade soups without salt. Eggs.  Dairy Milk. Soy milk. Ricotta cheese. Low-sodium or reduced-sodium cheeses. Yogurt.  Condiments Fresh and dried herbs and spices. Salt-free seasonings. Onion and garlic powders. Low-sodium varieties of mustard and ketchup. Lemon juice.  Fats and Oils Reduced-sodium salad dressings. Unsalted butter.   Other Unsalted popcorn and pretzels.  The items listed above may not be a complete list of recommended foods or beverages. Contact your dietitian for more options. WHAT FOODS ARE NOT RECOMMENDED? Grains Instant hot cereals. Bread stuffing, pancake, and biscuit mixes. Croutons. Seasoned rice or pasta mixes. Noodle soup cups. Boxed or frozen macaroni and cheese. Self-rising flour. Regular salted crackers. Vegetables Regular canned vegetables. Regular canned tomato sauce and paste. Regular tomato and vegetable juices. Frozen vegetables in sauces. Salted french fries. Olives. Angie Fava. Relishes. Sauerkraut. Salsa. Meat and Other Protein Products Salted, canned, smoked, spiced, or pickled meats, seafood, or fish. Bacon, ham, sausage, hot dogs, corned beef, chipped beef, and packaged luncheon meats. Salt pork. Jerky. Pickled herring. Anchovies, regular canned tuna, and sardines. Salted nuts. Dairy Processed cheese and cheese spreads. Cheese curds. Blue cheese and cottage cheese. Buttermilk.  Condiments Onion and garlic salt, seasoned salt, table salt, and sea salt. Canned and packaged gravies. Worcestershire sauce. Tartar sauce. Barbecue sauce. Teriyaki sauce. Soy sauce, including reduced sodium. Steak sauce. Fish sauce. Oyster sauce. Cocktail sauce. Horseradish. Regular ketchup and mustard. Meat flavorings and tenderizers. Bouillon cubes. Hot sauce. Tabasco sauce. Marinades. Taco seasonings. Relishes. Fats and Oils Regular salad dressings. Salted butter. Margarine. Ghee. Bacon fat.  Other Potato and tortilla chips. Corn chips and puffs. Salted popcorn and pretzels. Canned or dried soups. Pizza. Frozen entrees and pot pies.  The items listed above may not be a complete list of foods and beverages to avoid. Contact your dietitian for more information. Document Released: 11/15/2001 Document Revised: 05/31/2013 Document Reviewed: 03/30/2013 [facility] Patient Information 2015 [place], [place].  This information is not intended to replace advice given to you by your health care provider. Make sure you discuss any questions you have with your health care provider.

## 2015-03-12 NOTE — Assessment & Plan Note (Signed)
Patient continues to have the deep nonproductive cough that did not improve with stopping valsartan. Will refer to pulmonary.

## 2015-03-12 NOTE — Assessment & Plan Note (Signed)
Patient has increased edema and hasn't been taking her spironolactone as prescribed. She's also been eating a lot of salty foods. Recommend 2 g sodium diet. Increase spironolactone to 3 times a day.

## 2015-03-12 NOTE — Progress Notes (Signed)
Cardiology Office Note   Date:  03/12/2015   ID:  Erin Haynes, DOB 1941/03/29, MRN [id number]  PCP:  Kandice Hams, MD  Cardiologist:  Dr. Mare Ferrari  Chief Complaint: Chronic cough    History of Present Illness: Erin Haynes is a 74 y.o. female who presents for yearly follow-up. 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 Cardiac catheterization at that time showed only mild CAD with 40-50% ostial diagonal lesion. She has done well postop. She was last seen by Dr. Mare Ferrari 01/2014 at which time he stopped her valsartan because of a chronic cough. She also has history of diabetes mellitus, dyslipidemia, hypertension, CKD-saw renal specialist.  She complains of a lot of gas and belching. Also has the chronic deep nonproductive cough that didn't improve with stopping valsartan. She feels like she breaths heavy at times. Can't walk without a cane or walker because of chronic back pain. Overall her heart has been stable. She is going to be moving to [place] to live with her son in the next 6 months. She denies any chest pain, palpitations, dizziness or presyncope. She does have edema and has not taken any of her medication yet today. She doesn't always take her spironolactone 3 times a day. She was eating a lot of salty foods recently as well.   Past Medical History  Diagnosis Date  . Thyroid disease   . Hyperlipidemia   . Diabetes mellitus   . Vertigo   . Osteoarthritis   . Heart palpitations     hx of  . Back pain   . Angina   . Heart murmur     age 11  . Hypertension   . Anxiety   . Depression     occasional  . Atrial myxoma     s/p excision in November 2012; Dr. Roxy Manns.  . Sleep apnea     no cpap used  . Shortness of breath     when walking  . Hypothyroidism   . Chronic kidney disease     chronic kidney disease managed by dr polite  . History of blood transfusion 2012  . Complication of anesthesia nov 2012    trouble with scratchy voiceafter  heart surgery    Past Surgical History  Procedure Laterality Date  . Total knee arthroplasty      right and left  . Cardiac catheterization  2002, 04/15/2011    minimal nonobstructive CAD, TEE and stress test recent goes to Dr. Mare Ferrari  . Excision myxoma  04/17/2011    Procedure: EXCISION MYXOMA;  Surgeon: Rexene Alberts, MD;  Location: [facility];  Service: Open Heart Surgery;  Laterality: N/A;  Excision of Left Atrial Myxoma.  . Atrial myoma removed  nov 2012  . Back surgery  2006    ddd, bone spurs, lower  . Back surgery    . Back surgery  2006    ddd, bone spurs  . Abdominal hysterectomy      complete     Current Outpatient Prescriptions  Medication Sig Dispense Refill  . amLODipine (NORVASC) 10 MG tablet Take 10 mg by mouth daily.      Marland Kitchen aspirin 81 MG tablet Take 81 mg by mouth daily.    . citalopram (CELEXA) 10 MG tablet Take 20 mg by mouth daily.     . Coenzyme Q10 (CO Q 10 PO) Take 1 capsule by mouth daily.     . fluticasone (FLONASE) 50 MCG/ACT nasal spray Place  2 sprays into the nose at bedtime.    . gabapentin (NEURONTIN) 100 MG capsule Take 100 mg by mouth 3 (three) times daily.     Marland Kitchen JANUVIA 50 MG tablet Take 50 mg by mouth daily.     Marland Kitchen levothyroxine (SYNTHROID, LEVOTHROID) 137 MCG tablet Take 137 mcg by mouth daily.     . meclizine (ANTIVERT) 25 MG tablet Take 25 mg by mouth 3 (three) times daily as needed. For nausea and vomiting.    . metoprolol succinate (TOPROL-XL) 100 MG 24 hr tablet Take 100 mg by mouth daily.     . Omega-3 Fatty Acids (FISH OIL) 1200 MG CAPS Take 1 capsule by mouth daily.     . potassium chloride SA (K-DUR,KLOR-CON) 20 MEQ tablet Take 20 mEq by mouth once.    . rosuvastatin (CRESTOR) 10 MG tablet Take 10 mg by mouth daily.    . solifenacin (VESICARE) 5 MG tablet Take 5 mg by mouth daily as needed. For travel.    Marland Kitchen spironolactone-hydrochlorothiazide (ALDACTAZIDE) 25-25 MG per tablet Take 1 tablet by mouth 3 (three) times daily.    Marland Kitchen zolpidem  (AMBIEN) 10 MG tablet Take 5 mg by mouth at bedtime as needed. For sleep.     No current facility-administered medications for this visit.    Allergies:   Ivp dye; Lisinopril; and Valsartan    Social History:  The patient  reports that she quit smoking about 24 years ago. Her smoking use included Cigarettes. She has never used smokeless tobacco. She reports that she does not drink alcohol or use illicit drugs.   Family History:  The patient's family history includes Heart attack in her father and mother; Stroke in her mother.    ROS:  Please see the history of present illness.   Otherwise, review of systems are positive for none.   All other systems are reviewed and negative.    PHYSICAL EXAM: VS:  BP 132/70 mmHg  Pulse 59  Ht 5\' 7"  (1.702 m)  Wt 238 lb (107.956 kg)  BMI 37.27 kg/m2 , BMI Body mass index is 37.27 kg/(m^2). GEN: Well nourished, well developed, in no acute distress Neck: no JVD, HJR, carotid bruits, or masses Cardiac: RRR; no murmurs,gallop, rubs, thrill or heave,  Respiratory:  clear to auscultation bilaterally, normal work of breathing GI: soft, nontender, nondistended, + BS MS: no deformity or atrophy Extremities: Bilateral lower extremity edema right greater than left without cyanosis, clubbing,  good distal pulses bilaterally.  Skin: warm and dry, no rash Neuro:  Strength and sensation are intact    EKG:  EKG is ordered today. The ekg ordered today demonstrates sinus bradycardia 59 bpm, otherwise normal Recent Labs: No results found for requested labs within last 365 days.    Lipid Panel No results found for: CHOL, TRIG, HDL, CHOLHDL, VLDL, LDLCALC, LDLDIRECT    Wt Readings from Last 3 Encounters:  03/12/15 238 lb (107.956 kg)  02/06/14 242 lb 12.8 oz (110.133 kg)  02/09/13 240 lb 12.8 oz (109.226 kg)      Other studies Reviewed: Additional studies/ records that were reviewed today include and review of the records demonstrates:  2 Decho  04/30/12 Study Conclusions  - Left ventricle: The cavity size was normal. Wall thickness   was increased in a pattern of mild LVH. Systolic function   was normal. The estimated ejection fraction was in the   range of 55% to 60%. Wall motion was normal; there were no   regional  wall motion abnormalities. Doppler parameters are   consistent with abnormal left ventricular relaxation   (grade 1 diastolic dysfunction). - Mitral valve: Calcified annulus. Impressions:  - Mildly elevated LVOT gradient of 1.6 m/s most likely   related to vigorous LV function.   ASSESSMENT AND PLAN:  Coronary artery disease Stable without chest pain  Atrial myxoma Stable since her surgery in 2012. Patient will be moving to [place] in the next 6 months. We'll need to obtain a cardiologist there.  Cough Patient continues to have the deep nonproductive cough that did not improve with stopping valsartan. Will refer to pulmonary.  Edema extremities Patient has increased edema and hasn't been taking her spironolactone as prescribed. She's also been eating a lot of salty foods. Recommend 2 g sodium diet. Increase spironolactone to 3 times a day.  Dyslipidemia Followed by Dr. Delfina Redwood    Signed, Ermalinda Barrios, PA-C  03/12/2015 11:06 AM    [address], [place], [place]  [address] Phone: [phone]; Fax: [phone]

## 2015-03-12 NOTE — Assessment & Plan Note (Signed)
Stable without chest pain 

## 2015-03-12 NOTE — Assessment & Plan Note (Signed)
Stable since her surgery in 2012. Patient will be moving to [place] in the next 6 months. We'll need to obtain a cardiologist there.

## 2015-03-23 ENCOUNTER — Encounter: Payer: Self-pay | Admitting: Emergency Medicine

## 2015-03-23 ENCOUNTER — Ambulatory Visit (INDEPENDENT_AMBULATORY_CARE_PROVIDER_SITE_OTHER): Payer: Medicare Other | Admitting: Emergency Medicine

## 2015-03-23 VITALS — BP 146/70 | HR 56 | Ht 66.0 in | Wt 240.0 lb

## 2015-03-23 DIAGNOSIS — R05 Cough: Secondary | ICD-10-CM

## 2015-03-23 DIAGNOSIS — R059 Cough, unspecified: Secondary | ICD-10-CM

## 2015-03-23 NOTE — Patient Instructions (Addendum)
Please start pantoprazole 40mg  daily. Take this medication either 1 hour before or after eating.  Please change your fluticasone nasal spray to 2 sprays each side every day. Take it at least an hour before bedtime.  We will consider a repeat inspection of your throat and vocal cords depending on how you do on these medications.  We will consider performing full breathing testing at some point in the future depending on you cough.  Follow with Dr Lamonte Sakai in 1 month

## 2015-03-23 NOTE — Assessment & Plan Note (Signed)
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 It is somewhat different than her previous presentation in that her voice quality does not appear to be changed, suggesting that her polyp is likely not returned. I will treat both empirically, consider reevaluation of her upper airway and pulmonary function testing depending on how she responds.

## 2015-03-23 NOTE — Progress Notes (Signed)
Subjective:    Patient ID: Erin Haynes, female    DOB: 1940-08-08, 74 y.o.   MRN: [id number]  HPI 74 year old former smoker (small exposure), history of diabetes,untreated sleep apnea, hypothyroidism, chronic kidney disease, CAD, atrial myxoma s/p surgery. She is referred today for evaluation of chronic cough.  She has been having cough for the last 2-3 years, she relates it in time to when she had her atrial surgery.  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 Changed her voice quality.  She was started on GERD regimen and a nasal steroid. She now uses the nasal steroids prn, does not use the GERD regimen.    Review of Systems  Constitutional: Negative for fever and unexpected weight change.  HENT: Positive for trouble swallowing. Negative for congestion, dental problem, ear pain, nosebleeds, postnasal drip, rhinorrhea, sinus pressure, sneezing and sore throat.   Eyes: Negative for redness and itching.  Respiratory: Positive for cough and shortness of breath. Negative for chest tightness and wheezing.   Cardiovascular: Negative for palpitations and leg swelling.  Gastrointestinal: Negative for nausea and vomiting.  Genitourinary: Negative for dysuria.  Musculoskeletal: Negative for joint swelling.  Skin: Negative for rash.  Neurological: Negative for headaches.  Hematological: Does not bruise/bleed easily.  Psychiatric/Behavioral: Negative for dysphoric mood. The patient is not nervous/anxious.    Past Medical History  Diagnosis Date  . Thyroid disease   . Hyperlipidemia   . Diabetes mellitus   . Vertigo   . Osteoarthritis   . Heart palpitations     hx of  . Back pain   . Angina   . Heart murmur     age 32  . Hypertension   . Anxiety   . Depression     occasional  . Atrial myxoma     s/p excision in November 2012; Dr. Roxy Manns.  . Sleep apnea     no cpap used  . Shortness of breath     when walking  . Hypothyroidism   .  Chronic kidney disease     chronic kidney disease managed by dr polite  . History of blood transfusion 2012  . Complication of anesthesia nov 2012    trouble with scratchy voiceafter heart surgery     Family History  Problem Relation Age of Onset  . Heart attack Mother     age 40  . Stroke Mother   . Heart attack Father      Social History   Social History  . Marital Status: Single    Spouse Name: N/A  . Number of Children: N/A  . Years of Education: N/A   Occupational History  . Not on file.   Social History Main Topics  . Smoking status: Former Smoker    Types: Cigarettes    Quit date: 06/10/1990  . Smokeless tobacco: Never Used     Comment: social smoking only  . Alcohol Use: No  . Drug Use: No  . Sexual Activity: Not on file   Other Topics Concern  . Not on file   Social History Narrative     Allergies  Allergen Reactions  . Ivp Dye [Iodinated Diagnostic Agents] Anaphylaxis    Throat and face swelling  . Lisinopril     Cough   . Valsartan     COUGH      Outpatient Prescriptions Prior to Visit  Medication Sig Dispense Refill  . amLODipine (NORVASC) 10 MG tablet Take  10 mg by mouth daily.      Marland Kitchen aspirin 81 MG tablet Take 81 mg by mouth daily.    . citalopram (CELEXA) 10 MG tablet Take 20 mg by mouth daily.     . Coenzyme Q10 (CO Q 10 PO) Take 1 capsule by mouth daily.     . fluticasone (FLONASE) 50 MCG/ACT nasal spray Place 2 sprays into the nose at bedtime.    . gabapentin (NEURONTIN) 100 MG capsule Take 100 mg by mouth 3 (three) times daily.     Marland Kitchen JANUVIA 50 MG tablet Take 50 mg by mouth daily.     Marland Kitchen levothyroxine (SYNTHROID, LEVOTHROID) 137 MCG tablet Take 137 mcg by mouth daily.     . meclizine (ANTIVERT) 25 MG tablet Take 25 mg by mouth 3 (three) times daily as needed. For nausea and vomiting.    . metoprolol succinate (TOPROL-XL) 100 MG 24 hr tablet Take 100 mg by mouth daily.     . Omega-3 Fatty Acids (FISH OIL) 1200 MG CAPS Take 1 capsule by  mouth daily.     . potassium chloride SA (K-DUR,KLOR-CON) 20 MEQ tablet Take 20 mEq by mouth once.    . rosuvastatin (CRESTOR) 10 MG tablet Take 10 mg by mouth daily.    . solifenacin (VESICARE) 5 MG tablet Take 5 mg by mouth daily as needed. For travel.    Marland Kitchen spironolactone-hydrochlorothiazide (ALDACTAZIDE) 25-25 MG per tablet Take 1 tablet by mouth 3 (three) times daily.    Marland Kitchen zolpidem (AMBIEN) 10 MG tablet Take 5 mg by mouth at bedtime as needed. For sleep.     No facility-administered medications prior to visit.         Objective:   Physical Exam Filed Vitals:   03/23/15 1551 03/23/15 1552  BP:  146/70  Pulse:  56  Height: 5\' 6"  (1.676 m)   Weight: 240 lb (108.863 kg)   SpO2:  96%   Gen: Pleasant, overweight woman, in no distress,  normal affect  ENT: No lesions,  mouth clear,  oropharynx clear, no postnasal drip  Neck: No JVD, no stridor  Lungs: No use of accessory muscles, clear without rales or rhonchi  Cardiovascular: RRR, heart sounds normal, no murmur or gallops, no peripheral edema  Musculoskeletal: No deformities, no cyanosis or clubbing  Neuro: alert, non focal  Skin: Warm, no lesions or rashes     Assessment & Plan:  Cough 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 It is somewhat different than her previous presentation in that her voice quality does not appear to be changed, suggesting that her polyp is likely not returned. I will treat both empirically, consider reevaluation of her upper airway and pulmonary function testing depending on how she responds.

## 2015-03-28 ENCOUNTER — Telehealth: Payer: Self-pay | Admitting: Emergency Medicine

## 2015-03-28 MED ORDER — PANTOPRAZOLE SODIUM 40 MG PO TBEC: 40.0000 mg | DELAYED_RELEASE_TABLET | Freq: Every day | ORAL | Status: AC

## 2015-03-28 NOTE — Telephone Encounter (Signed)
Called and spoke with pt Pt stated that Dr Lamonte Sakai instructed her at [facility] on 03/23/15 to start pantaprozole but medication had not been sent to her pharmacy   Please start pantoprazole 40mg  daily. Take this medication either 1 hour before or after eating.  Please change your fluticasone nasal spray to 2 sprays each side every day. Take it at least an hour before bedtime.  We will consider a repeat inspection of your throat and vocal cords depending on how you do on these medications.  We will consider performing full breathing testing at some point in the future depending on you cough.  Follow with Dr Lamonte Sakai in 1 month  Apologized to pt for the inconvenience and informed that i would sent rx now to pharmacy Pharmacy verified with pt  Medication sent electronically to pharmacy  Nothing further is needed

## 2015-04-23 ENCOUNTER — Ambulatory Visit: Payer: Medicare Other | Admitting: Emergency Medicine

## 2015-06-26 ENCOUNTER — Telehealth: Payer: Self-pay | Admitting: Emergency Medicine

## 2015-06-26 NOTE — Telephone Encounter (Signed)
I do not see any flu shot documented. I called spoke with pt and made her aware. She needed nothing further
